# Patient Record
Sex: Female | Born: 1995 | Race: White | Hispanic: No | Marital: Married | Smoking: Never smoker
Health system: Southern US, Community
[De-identification: ages and names within clinical notes are randomized; demographics above are authoritative.]

## PROBLEM LIST (undated history)

## (undated) DIAGNOSIS — Z789 Other specified health status: Secondary | ICD-10-CM

## (undated) HISTORY — PX: NO PAST SURGERIES: SHX2092

---

## 2016-06-04 ENCOUNTER — Other Ambulatory Visit: Payer: Self-pay

## 2016-06-04 ENCOUNTER — Encounter: Payer: Self-pay | Admitting: Certified Nurse Midwife

## 2016-06-04 ENCOUNTER — Ambulatory Visit (INDEPENDENT_AMBULATORY_CARE_PROVIDER_SITE_OTHER): Payer: Managed Care, Other (non HMO) | Admitting: Certified Nurse Midwife

## 2016-06-04 VITALS — BP 109/69 | HR 84 | Ht 67.0 in | Wt 161.8 lb

## 2016-06-04 DIAGNOSIS — Z01419 Encounter for gynecological examination (general) (routine) without abnormal findings: Secondary | ICD-10-CM

## 2016-06-04 DIAGNOSIS — Z0000 Encounter for general adult medical examination without abnormal findings: Secondary | ICD-10-CM

## 2016-06-04 NOTE — Patient Instructions (Signed)
Preventive Care 18-39 Years, Female Preventive care refers to lifestyle choices and visits with your health care provider that can promote health and wellness. What does preventive care include?  A yearly physical exam. This is also called an annual well check.  Dental exams once or twice a year.  Routine eye exams. Ask your health care provider how often you should have your eyes checked.  Personal lifestyle choices, including:  Daily care of your teeth and gums.  Regular physical activity.  Eating a healthy diet.  Avoiding tobacco and drug use.  Limiting alcohol use.  Practicing safe sex.  Taking vitamin and mineral supplements as recommended by your health care provider. What happens during an annual well check? The services and screenings done by your health care provider during your annual well check will depend on your age, overall health, lifestyle risk factors, and family history of disease. Counseling  Your health care provider may ask you questions about your:  Alcohol use.  Tobacco use.  Drug use.  Emotional well-being.  Home and relationship well-being.  Sexual activity.  Eating habits.  Work and work environment.  Method of birth control.  Menstrual cycle.  Pregnancy history. Screening  You may have the following tests or measurements:  Height, weight, and BMI.  Diabetes screening. This is done by checking your blood sugar (glucose) after you have not eaten for a while (fasting).  Blood pressure.  Lipid and cholesterol levels. These may be checked every 5 years starting at age 23.  Skin check.  Hepatitis C blood test.  Hepatitis B blood test.  Sexually transmitted disease (STD) testing.  BRCA-related cancer screening. This may be done if you have a family history of breast, ovarian, tubal, or peritoneal cancers.  Pelvic exam and Pap test. This may be done every 3 years starting at age 23. Starting at age 23, this may be done every 5  years if you have a Pap test in combination with an HPV test. Discuss your test results, treatment options, and if necessary, the need for more tests with your health care provider. Vaccines  Your health care provider may recommend certain vaccines, such as:  Influenza vaccine. This is recommended every year.  Tetanus, diphtheria, and acellular pertussis (Tdap, Td) vaccine. You may need a Td booster every 10 years.  Varicella vaccine. You may need this if you have not been vaccinated.  HPV vaccine. If you are 26 or younger, you may need three doses over 6 months.  Measles, mumps, and rubella (MMR) vaccine. You may need at least one dose of MMR. You may also need a second dose.  Pneumococcal 13-valent conjugate (PCV13) vaccine. You may need this if you have certain conditions and were not previously vaccinated.  Pneumococcal polysaccharide (PPSV23) vaccine. You may need one or two doses if you smoke cigarettes or if you have certain conditions.  Meningococcal vaccine. One dose is recommended if you are age 19-21 years and a first-year college student living in a residence hall, or if you have one of several medical conditions. You may also need additional booster doses.  Hepatitis A vaccine. You may need this if you have certain conditions or if you travel or work in places where you may be exposed to hepatitis A.  Hepatitis B vaccine. You may need this if you have certain conditions or if you travel or work in places where you may be exposed to hepatitis B.  Haemophilus influenzae type b (Hib) vaccine. You may need this   if you have certain risk factors. Talk to your health care provider about which screenings and vaccines you need and how often you need them. This information is not intended to replace advice given to you by your health care provider. Make sure you discuss any questions you have with your health care provider. Document Released: 04/09/2001 Document Revised: 11/01/2015  Document Reviewed: 12/13/2014 Elsevier Interactive Patient Education  2017 Elsevier Inc.  Intrauterine Device Information An intrauterine device (IUD) is inserted into your uterus to prevent pregnancy. There are two types of IUDs available:  Copper IUD-This type of IUD is wrapped in copper wire and is placed inside the uterus. Copper makes the uterus and fallopian tubes produce a fluid that kills sperm. The copper IUD can stay in place for 10 years.  Hormone IUD-This type of IUD contains the hormone progestin (synthetic progesterone). The hormone thickens the cervical mucus and prevents sperm from entering the uterus. It also thins the uterine lining to prevent implantation of a fertilized egg. The hormone can weaken or kill the sperm that get into the uterus. One type of hormone IUD can stay in place for 5 years, and another type can stay in place for 3 years. Your health care provider will make sure you are a good candidate for a contraceptive IUD. Discuss with your health care provider the possible side effects. Advantages of an intrauterine device  IUDs are highly effective, reversible, long acting, and low maintenance.  There are no estrogen-related side effects.  An IUD can be used when breastfeeding.  IUDs are not associated with weight gain.  The copper IUD works immediately after insertion.  The hormone IUD works right away if inserted within 7 days of your period starting. You will need to use a backup method of birth control for 7 days if the hormone IUD is inserted at any other time in your cycle.  The copper IUD does not interfere with your female hormones.  The hormone IUD can make heavy menstrual periods lighter and decrease cramping.  The hormone IUD can be used for 3 or 5 years.  The copper IUD can be used for 10 years. Disadvantages of an intrauterine device  The hormone IUD can be associated with irregular bleeding patterns.  The copper IUD can make your  menstrual flow heavier and more painful.  You may experience cramping and vaginal bleeding after insertion. This information is not intended to replace advice given to you by your health care provider. Make sure you discuss any questions you have with your health care provider. Document Released: 01/16/2004 Document Revised: 07/20/2015 Document Reviewed: 08/02/2012 Elsevier Interactive Patient Education  2017 Elsevier Inc. Etonogestrel implant What is this medicine? ETONOGESTREL (et oh noe JES trel) is a contraceptive (birth control) device. It is used to prevent pregnancy. It can be used for up to 3 years. This medicine may be used for other purposes; ask your health care provider or pharmacist if you have questions. COMMON BRAND NAME(S): Implanon, Nexplanon What should I tell my health care provider before I take this medicine? They need to know if you have any of these conditions: -abnormal vaginal bleeding -blood vessel disease or blood clots -cancer of the breast, cervix, or liver -depression -diabetes -gallbladder disease -headaches -heart disease or recent heart attack -high blood pressure -high cholesterol -kidney disease -liver disease -renal disease -seizures -tobacco smoker -an unusual or allergic reaction to etonogestrel, other hormones, anesthetics or antiseptics, medicines, foods, dyes, or preservatives -pregnant or trying to get  pregnant -breast-feeding How should I use this medicine? This device is inserted just under the skin on the inner side of your upper arm by a health care professional. Talk to your pediatrician regarding the use of this medicine in children. Special care may be needed. Overdosage: If you think you have taken too much of this medicine contact a poison control center or emergency room at once. NOTE: This medicine is only for you. Do not share this medicine with others. What if I miss a dose? This does not apply. What may interact with this  medicine? Do not take this medicine with any of the following medications: -amprenavir -bosentan -fosamprenavir This medicine may also interact with the following medications: -barbiturate medicines for inducing sleep or treating seizures -certain medicines for fungal infections like ketoconazole and itraconazole -grapefruit juice -griseofulvin -medicines to treat seizures like carbamazepine, felbamate, oxcarbazepine, phenytoin, topiramate -modafinil -phenylbutazone -rifampin -rufinamide -some medicines to treat HIV infection like atazanavir, indinavir, lopinavir, nelfinavir, tipranavir, ritonavir -St. John's wort This list may not describe all possible interactions. Give your health care provider a list of all the medicines, herbs, non-prescription drugs, or dietary supplements you use. Also tell them if you smoke, drink alcohol, or use illegal drugs. Some items may interact with your medicine. What should I watch for while using this medicine? This product does not protect you against HIV infection (AIDS) or other sexually transmitted diseases. You should be able to feel the implant by pressing your fingertips over the skin where it was inserted. Contact your doctor if you cannot feel the implant, and use a non-hormonal birth control method (such as condoms) until your doctor confirms that the implant is in place. If you feel that the implant may have broken or become bent while in your arm, contact your healthcare provider. What side effects may I notice from receiving this medicine? Side effects that you should report to your doctor or health care professional as soon as possible: -allergic reactions like skin rash, itching or hives, swelling of the face, lips, or tongue -breast lumps -changes in emotions or moods -depressed mood -heavy or prolonged menstrual bleeding -pain, irritation, swelling, or bruising at the insertion site -scar at site of insertion -signs of infection at the  insertion site such as fever, and skin redness, pain or discharge -signs of pregnancy -signs and symptoms of a blood clot such as breathing problems; changes in vision; chest pain; severe, sudden headache; pain, swelling, warmth in the leg; trouble speaking; sudden numbness or weakness of the face, arm or leg -signs and symptoms of liver injury like dark yellow or brown urine; general ill feeling or flu-like symptoms; light-colored stools; loss of appetite; nausea; right upper belly pain; unusually weak or tired; yellowing of the eyes or skin -unusual vaginal bleeding, discharge -signs and symptoms of a stroke like changes in vision; confusion; trouble speaking or understanding; severe headaches; sudden numbness or weakness of the face, arm or leg; trouble walking; dizziness; loss of balance or coordination Side effects that usually do not require medical attention (report to your doctor or health care professional if they continue or are bothersome): -acne -back pain -breast pain -changes in weight -dizziness -general ill feeling or flu-like symptoms -headache -irregular menstrual bleeding -nausea -sore throat -vaginal irritation or inflammation This list may not describe all possible side effects. Call your doctor for medical advice about side effects. You may report side effects to FDA at 1-800-FDA-1088. Where should I keep my medicine? This drug is given  in a hospital or clinic and will not be stored at home. NOTE: This sheet is a summary. It may not cover all possible information. If you have questions about this medicine, talk to your doctor, pharmacist, or health care provider.  2018 Elsevier/Gold Standard (2015-08-31 11:19:22)

## 2016-06-04 NOTE — Progress Notes (Signed)
ANNUAL PREVENTATIVE CARE GYN  ENCOUNTER NOTE  Subjective:       April Nicholson is a 21 y.o. G0P0000 female here for a routine annual gynecologic exam.    April Nicholson is currently taking LoLo Estrin and would like to change to a highly effective, low maintenance, lower cost form of pregnancy prevention.  Denies difficulty breathing or respiratory distress, chest pain, abdominal pain, unexplained vaginal bleeding, and leg pain or swelling.    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. She moved here from [place] in December 2017 with her husband.   Gynecologic History  Patient's last menstrual period was 05/13/2016 (approximate).   Contraception: OCP (estrogen/progesterone)   Last Pap: never.  Obstetric History OB History  Gravida Para Term Preterm AB Living  0 0 0 0 0 0  SAB TAB Ectopic Multiple Live Births  0 0 0 0 0        Allergies  Allergen Reactions  . Compazine [Prochlorperazine Edisylate] Other (See Comments)    seizure    Social History   Social History  . Marital status: Married    Spouse name: N/A  . Number of children: N/A  . Years of education: N/A   Occupational History  . Not on file.   Social History Main Topics  . Smoking status: Never Smoker  . Smokeless tobacco: Never Used  . Alcohol use No  . Drug use: No  . Sexual activity: Yes    Partners: Male    Birth control/ protection: Pill   Other Topics Concern  . Not on file   Social History Narrative  . No narrative on file    Family History  Problem Relation Age of Onset  . Asthma Maternal Grandmother   . Hypertension Maternal Grandmother     The following portions of the patient's history were reviewed and updated as appropriate: allergies, current medications, past family history, past medical history, past social history, past surgical history and problem list.  Review of Systems  ROS negative except as noted above. Information obtained from patient.    Objective:   BP 109/69    Pulse 84   Ht  (1.702 m)   Wt 161 lb 12.8 oz (73.4 kg)   LMP 05/13/2016 (Approximate)   BMI 25.34 kg/m    CONSTITUTIONAL: Well-developed, well-nourished female in no acute distress.   PSYCHIATRIC: Normal mood and affect. Normal behavior. Normal judgment and thought content.  NEUROLGIC: Alert and oriented to person, place, and time. Normal muscle tone coordination. No cranial nerve deficit noted.  HENT:  Normocephalic, atraumatic, External right and left ear normal. Oropharynx is clear and moist.  EYES: Conjunctivae and EOM are normal. Pupils are equal, round, and reactive to light. No scleral icterus.   NECK: Normal range of motion, supple, no masses.  Normal thyroid.   SKIN: Skin is warm and dry. No rash noted. Not diaphoretic. No erythema. No pallor.  CARDIOVASCULAR: Normal heart rate noted, regular rhythm, no murmur.  RESPIRATORY: Clear to auscultation bilaterally. Effort and breath sounds normal, no problems with respiration noted.  BREASTS: Symmetric in size. No masses, skin changes, nipple drainage, or lymphadenopathy.  ABDOMEN: Soft, normal bowel sounds, no distention noted.  No tenderness, rebound or guarding.   PELVIC:  External Genitalia: Normal  Vagina: Normal  Cervix: Normal  Uterus: Normal  Adnexa: Normal   MUSCULOSKELETAL: Normal range of motion. No tenderness.  No cyanosis, clubbing, or edema.  2+ distal pulses.  LYMPHATIC: No Axillary, Supraclavicular, or Inguinal Adenopathy.  Assessment:   Annual gynecologic examination 21 y.o.   Contraception: IUD and Nexplanon   Overweight   Problem List Items Addressed This Visit    None    Visit Diagnoses    Wellness examination    -  Primary   Relevant Orders   Cytology - PAP      Plan:   Pap: Pap, Reflex if ASCUS   Labs: Declines   Routine preventative health maintenance measures emphasized: Exercise/Diet/Weight control, Alcohol/Substance use risks, Stress Management and Peer Pressure Issues    Education given regarding options for contraception, including injectable contraception, IUD placement, oral contraceptives, hormonal implant.  RTC x 1 year for annual exam  RTC x 2 weeks for IUD or Nexplanon placement   Gunnar Bulla, CNM

## 2016-06-06 LAB — CYTOLOGY - PAP

## 2016-06-08 ENCOUNTER — Encounter: Payer: Self-pay | Admitting: Certified Nurse Midwife

## 2016-06-11 ENCOUNTER — Ambulatory Visit (INDEPENDENT_AMBULATORY_CARE_PROVIDER_SITE_OTHER): Payer: Managed Care, Other (non HMO) | Admitting: Certified Nurse Midwife

## 2016-06-11 ENCOUNTER — Other Ambulatory Visit: Payer: Self-pay | Admitting: Certified Nurse Midwife

## 2016-06-11 ENCOUNTER — Other Ambulatory Visit (INDEPENDENT_AMBULATORY_CARE_PROVIDER_SITE_OTHER): Payer: Managed Care, Other (non HMO)

## 2016-06-11 ENCOUNTER — Encounter: Payer: Self-pay | Admitting: Certified Nurse Midwife

## 2016-06-11 VITALS — BP 108/68 | HR 63 | Ht 67.0 in | Wt 162.0 lb

## 2016-06-11 DIAGNOSIS — Z30014 Encounter for initial prescription of intrauterine contraceptive device: Secondary | ICD-10-CM

## 2016-06-11 LAB — POCT URINE PREGNANCY: PREG TEST UR: NEGATIVE

## 2016-06-11 NOTE — Patient Instructions (Signed)

## 2016-06-13 ENCOUNTER — Other Ambulatory Visit: Payer: Managed Care, Other (non HMO)

## 2016-06-17 ENCOUNTER — Ambulatory Visit: Payer: Managed Care, Other (non HMO) | Admitting: Certified Nurse Midwife

## 2016-06-19 NOTE — Progress Notes (Signed)
April Nicholson is a 21 y.o. year old G36P0000 Caucasian female who presents for placement of a Liletta IUD.  BP 108/68   Pulse 63   Ht  (1.702 m)   Wt 162 lb (73.5 kg)   LMP 05/13/2016 (Approximate)   BMI 25.37 kg/m    Pregnancy test today was negative. Pt was taking OCPs prior to today's visit and started placebo pills yesterday.   The risks and benefits of the method and placement have been thouroughly reviewed with the patient and all questions were answered.  Specifically the patient is aware of failure rate of 02/998, expulsion of the IUD and of possible perforation.  The patient is aware of irregular bleeding due to the method and understands the incidence of irregular bleeding diminishes with time.  Signed copy of informed consent in chart.   Time out was performed.  A medium plastic speculum was placed in the vagina.  The cervix was visualized, prepped using Betadine, and grasped with a single tooth tenaculum. The uterus was found to be neutral and it sounded to 6 cm.  Liletta IUD placed per manufacturer's recommendations.   The strings were trimmed to 3 cm.  The patient was given post procedure instructions, including signs and symptoms of infection and to check for the strings after each menses or each month, and refraining from intercourse or anything in the vagina for 3 days.  She was given a [place] care card with date Lilletta placed, and date Liletta to be removed.  Immediately following the procedure, [name] reported severe abdominal pain and began to vomit.   Sonogram was performed and the proper placement of the IUD was verified via transvaginal u/s. See below.   Advised pt uterine cramping normal after insertion of IUD and vomiting likely due to vasovagal response.   Reviewed red flag symptoms and when to call including PAINS acroymn.   RTC x 6 weeks for string check or sooner if needed.    Gunnar Bulla, CNM  ULTRASOUND REPORT  Location:  [facility] Date of Service:  06/11/16   Indications:IUD placement and Pelvic Pain Findings:  The uterus measures 6.4 x 3 x 4 cm. Echo texture is homogenous without evidence of focal masses.  The Endometrium measures 4.6 mm. IUD is seen in the fundal endometrium.  Right Ovary measures 4.1 x 3 x 3.8 cm. Small simple cyst on right ovary measures 3.3 x 2.2 x 3cm. Left Ovary measures 2.7 x 1.3 x 1.7 cm. It is normal appearance. Survey of the adnexa demonstrates no adnexal masses. There is no free fluid in the cul de sac.  Impression: 1. Small simple cyst on right ovary.  Recommendations: 1.Clinical correlation with the patient's History and Physical Exam.   Gunnar Bulla, CNM

## 2016-07-10 ENCOUNTER — Encounter: Payer: Self-pay | Admitting: Certified Nurse Midwife

## 2016-07-26 ENCOUNTER — Ambulatory Visit (INDEPENDENT_AMBULATORY_CARE_PROVIDER_SITE_OTHER): Payer: Managed Care, Other (non HMO) | Admitting: Certified Nurse Midwife

## 2016-07-26 ENCOUNTER — Encounter: Payer: Self-pay | Admitting: Certified Nurse Midwife

## 2016-07-26 VITALS — BP 105/64 | HR 61 | Ht 67.0 in | Wt 153.6 lb

## 2016-07-26 DIAGNOSIS — Z30431 Encounter for routine checking of intrauterine contraceptive device: Secondary | ICD-10-CM | POA: Diagnosis not present

## 2016-07-26 NOTE — Progress Notes (Signed)
Here for IUD string check                                                                                                               Pt is here for an IUD string check. No c/o.

## 2016-07-28 MED ORDER — LEVONORGESTREL 18.6 MCG/DAY IU IUD: INTRAUTERINE_SYSTEM | Freq: Once | INTRAUTERINE | Status: DC

## 2016-07-28 NOTE — Progress Notes (Signed)
GYN ENCOUNTER NOTE  Subjective:       April Nicholson is a 21 y.o. G0P0000 female here for IUD string check.   Her IUD was placed by myself on 06/11/2016 and placement was verified immediately following the procedure with ultrasound.   She reports spotting for the first two (2) to three (3) weeks after placement.   Denies difficulty breathing or respiratory distress, chest pain, abdominal pain, vaginal bleeding, dyspareunia, and leg pain or swelling.   Gynecologic History  No LMP recorded. Patient is not currently having periods (Reason: IUD).  Contraception: IUD/Liletta  Last Pap: 06/04/2016. Results were: normal  Obstetric History  OB History  Gravida Para Term Preterm AB Living  0 0 0 0 0 0  SAB TAB Ectopic Multiple Live Births  0 0 0 0 0       History reviewed. No pertinent past medical history.  History reviewed. No pertinent surgical history.  Current Outpatient Prescriptions on File Prior to Visit  Medication Sig Dispense Refill  . loratadine (CLARITIN) 10 MG tablet Take 10 mg by mouth daily.     No current facility-administered medications on file prior to visit.     Allergies  Allergen Reactions  . Compazine [Prochlorperazine Edisylate] Other (See Comments)    seizure    Social History   Social History  . Marital status: Married    Spouse name: N/A  . Number of children: N/A  . Years of education: N/A   Occupational History  . Not on file.   Social History Main Topics  . Smoking status: Never Smoker  . Smokeless tobacco: Never Used  . Alcohol use No  . Drug use: No  . Sexual activity: Yes    Partners: Male    Birth control/ protection: IUD   Other Topics Concern  . Not on file   Social History Narrative  . No narrative on file    Family History  Problem Relation Age of Onset  . Asthma Maternal Grandmother   . Hypertension Maternal Grandmother     The following portions of the patient's history were reviewed and updated as  appropriate: allergies, current medications, past family history, past medical history, past social history, past surgical history and problem list.  Review of Systems  Review of Systems - Negative except as noted above. History obtained from the patient.  Objective:   BP 105/64   Pulse 61   Ht 5\' 7"  (1.702 m)   Wt 153 lb 9 oz (69.7 kg)   BMI 24.05 kg/m   CONSTITUTIONAL: Well-developed, well-nourished female in no acute distress.   PELVIC:  External Genitalia: Normal  BUS: Normal  Vagina: Normal  Cervix: Normal, strings present   Assessment:   1. IUD check up  Plan:   Reviewed red flag symptoms and when to call.   RTC x 1 year for annual exam or sooner if needed.    Gunnar BullaJenkins Michelle Meryn Sarracino, CNM

## 2016-07-28 NOTE — Patient Instructions (Signed)

## 2017-05-18 ENCOUNTER — Encounter: Payer: Self-pay | Admitting: Certified Nurse Midwife

## 2017-05-20 ENCOUNTER — Other Ambulatory Visit: Payer: Self-pay | Admitting: Certified Nurse Midwife

## 2017-05-20 DIAGNOSIS — Z30431 Encounter for routine checking of intrauterine contraceptive device: Secondary | ICD-10-CM

## 2017-05-20 NOTE — Telephone Encounter (Signed)
Advise patient to make an appointment for ultrasound to check placement and to discuss mood changed. I am out until Thursday, but she may see another midwife if a different day is better for her. Thanks, JML

## 2017-05-21 ENCOUNTER — Ambulatory Visit (INDEPENDENT_AMBULATORY_CARE_PROVIDER_SITE_OTHER): Payer: Managed Care, Other (non HMO) | Admitting: Certified Nurse Midwife

## 2017-05-21 ENCOUNTER — Ambulatory Visit (INDEPENDENT_AMBULATORY_CARE_PROVIDER_SITE_OTHER): Payer: BLUE CROSS/BLUE SHIELD

## 2017-05-21 VITALS — BP 107/62 | HR 56 | Ht 67.0 in | Wt 152.1 lb

## 2017-05-21 DIAGNOSIS — Z30431 Encounter for routine checking of intrauterine contraceptive device: Secondary | ICD-10-CM

## 2017-05-21 DIAGNOSIS — N939 Abnormal uterine and vaginal bleeding, unspecified: Secondary | ICD-10-CM | POA: Diagnosis not present

## 2017-05-21 MED ORDER — LEVONORGESTREL-ETHINYL ESTRAD 0.15-30 MG-MCG PO TABS: 1.0000 | ORAL_TABLET | Freq: Every day | ORAL | 11 refills | Status: DC

## 2017-05-21 NOTE — Patient Instructions (Signed)

## 2017-05-21 NOTE — Progress Notes (Signed)
Pt is here for a followup on IUD placement U/S.

## 2017-05-21 NOTE — Progress Notes (Signed)
GYN ENCOUNTER NOTE  Subjective:       April Nicholson is a 22 y.o. G0P0000 female is here for gynecologic evaluation of the following issues:  1. Abnormal uterine bleeding with IUD placement and hormonal mood changes.  She had an IUD place in April 2018 with no issues until recently she has notieced that the period is becoming heavier. She was advised by Serafina RoyalsMichelle Lawhorn CNM to make an appointment today for u/s to confirm placement.    Gynecologic History No LMP recorded. (Menstrual status: IUD). Contraception: IUD Last Pap: 06/04/16. Results were: normal   Obstetric History OB History  Gravida Para Term Preterm AB Living  0 0 0 0 0 0  SAB TAB Ectopic Multiple Live Births  0 0 0 0 0    No past medical history on file.  No past surgical history on file.  Current Outpatient Medications on File Prior to Visit  Medication Sig Dispense Refill  . Levonorgestrel (LILETTA) 19.5 MCG/DAY IUD IUD 1 each by Intrauterine route once.    . loratadine (CLARITIN) 10 MG tablet Take 10 mg by mouth daily.     Current Facility-Administered Medications on File Prior to Visit  Medication Dose Route Frequency Provider Last Rate Last Dose  . levonorgestrel (LILETTA) 18.6 MCG/DAY IUD   Intrauterine Once Lawhorn, Vanessa DurhamJenkins Michelle, CNM        Allergies  Allergen Reactions  . Compazine [Prochlorperazine Edisylate] Other (See Comments)    seizure    Social History   Socioeconomic History  . Marital status: Married    Spouse name: Not on file  . Number of children: Not on file  . Years of education: Not on file  . Highest education level: Not on file  Occupational History  . Not on file  Social Needs  . Financial resource strain: Not on file  . Food insecurity:    Worry: Not on file    Inability: Not on file  . Transportation needs:    Medical: Not on file    Non-medical: Not on file  Tobacco Use  . Smoking status: Never Smoker  . Smokeless tobacco: Never Used  Substance and Sexual  Activity  . Alcohol use: No  . Drug use: No  . Sexual activity: Yes    Partners: Male    Birth control/protection: IUD  Lifestyle  . Physical activity:    Days per week: Not on file    Minutes per session: Not on file  . Stress: Not on file  Relationships  . Social connections:    Talks on phone: Not on file    Gets together: Not on file    Attends religious service: Not on file    Active member of club or organization: Not on file    Attends meetings of clubs or organizations: Not on file    Relationship status: Not on file  . Intimate partner violence:    Fear of current or ex partner: Not on file    Emotionally abused: Not on file    Physically abused: Not on file    Forced sexual activity: Not on file  Other Topics Concern  . Not on file  Social History Narrative  . Not on file    Family History  Problem Relation Age of Onset  . Asthma Maternal Grandmother   . Hypertension Maternal Grandmother     The following portions of the patient's history were reviewed and updated as appropriate: allergies, current medications, past family history, past medical  history, past social history, past surgical history and problem list.  Review of Systems Review of Systems - Negative except as mentioned in HPI Review of Systems - General ROS: negative for - chills, fatigue, fever, hot flashes, malaise or night sweats Hematological and Lymphatic ROS: negative for - bleeding problems or swollen lymph nodes Gastrointestinal ROS: negative for - abdominal pain, blood in stools, change in bowel habits and nausea/vomiting Musculoskeletal ROS: negative for - joint pain, muscle pain or muscular weakness Genito-Urinary ROS: negative for - change in menstrual cycle, dysmenorrhea, dyspareunia, dysuria, genital discharge, genital ulcers, hematuria, incontinence, irregular bleeding, nocturia or pelvic pain. Positive for /heavy menses, and mood changes  Objective:   BP 107/62   Pulse (!) 56   Ht  5\' 7"  (1.702 m)   Wt 152 lb 1 oz (69 kg)   BMI 23.82 kg/m  CONSTITUTIONAL: Well-developed, well-nourished female in no acute distress.  HENT:  Normocephalic, atraumatic.  NECK: Normal range of motion, supple, no masses.   SKIN: Skin is warm and dry. No rash noted. Not diaphoretic. No erythema. No pallor. NEUROLGIC: Alert and oriented to person, place, and time.  PSYCHIATRIC: Normal mood and affect. Normal behavior. Normal judgment and thought content. CARDIOVASCULAR:Not Examined RESPIRATORY: Not Examined BREASTS: Not Examined ABDOMEN: Soft, non distended; Non tender.  No Organomegaly. PELVIC: not indicated MUSCULOSKELETAL: Normal range of motion. No tenderness.  No cyanosis, clubbing, or edema.  ULTRASOUND REPORT  Location: [facility] Date of Service:  05/21/2017   Indications: IUD Check Findings:  The uterus measures 6.0 x 4.4 x 2.6 cm. Echo texture is homogeneous without evidence of focal masses. The Endometrium is unable to be delineated due to the presence of the IUD. The IUD appears to be in the correct location within the endometrium.  Right Ovary measures 3.9 x 2.4 x 1.8 cm. It is normal in appearance. Left Ovary measures 2.2 x 1.4 x 1.2 cm. It is normal appearance. Survey of the adnexa demonstrates no adnexal masses. There is no free fluid in the cul de sac.  Impression: 1. Anteverted uterus appears of normal size and contour. 2. The IUD appears to be in the correct location within the endometrium. 3. Bilateral ovaries appear WNL.  Recommendations: 1.Clinical correlation with the patient's History and Physical Exam.   Assessment:   IUD in correct position    Plan:   Reviewed u/s results. Discussed options of use of birth control pill x 3 months to help control bleeding and improve mood or use of lysteda to help manage heavy bleeding. Reviewed risks and benefits. Discussed option of removing IUD. She would like to try the birth control pill x 3  months . Reviewed use. She has taken in the past. She denies any contra indications for pill. Follow up PRN.   I attest more than 50% of visit spent reviewing history, discussing u/s results with patient , discussing treatment options and answering questions. Face to face time 15 min.  Doreene Burke, CNM  [facility]

## 2017-07-28 ENCOUNTER — Encounter: Payer: Managed Care, Other (non HMO) | Admitting: Certified Nurse Midwife

## 2020-09-27 DIAGNOSIS — Z3481 Encounter for supervision of other normal pregnancy, first trimester: Secondary | ICD-10-CM | POA: Diagnosis not present

## 2020-09-27 DIAGNOSIS — Z3685 Encounter for antenatal screening for Streptococcus B: Secondary | ICD-10-CM | POA: Diagnosis not present

## 2020-09-27 LAB — OB RESULTS CONSOLE GC/CHLAMYDIA
Chlamydia: NEGATIVE
Gonorrhea: NEGATIVE

## 2020-09-27 LAB — OB RESULTS CONSOLE ANTIBODY SCREEN: Antibody Screen: NEGATIVE

## 2020-09-27 LAB — OB RESULTS CONSOLE RUBELLA ANTIBODY, IGM: Rubella: IMMUNE

## 2020-09-27 LAB — OB RESULTS CONSOLE ABO/RH: RH Type: POSITIVE

## 2020-09-27 LAB — HEPATITIS C ANTIBODY: HCV Ab: NEGATIVE

## 2020-09-27 LAB — OB RESULTS CONSOLE RPR: RPR: NONREACTIVE

## 2020-09-27 LAB — OB RESULTS CONSOLE HEPATITIS B SURFACE ANTIGEN: Hepatitis B Surface Ag: NEGATIVE

## 2020-09-27 LAB — OB RESULTS CONSOLE HIV ANTIBODY (ROUTINE TESTING): HIV: NONREACTIVE

## 2020-09-28 DIAGNOSIS — Z3400 Encounter for supervision of normal first pregnancy, unspecified trimester: Secondary | ICD-10-CM | POA: Diagnosis not present

## 2020-09-28 DIAGNOSIS — Z113 Encounter for screening for infections with a predominantly sexual mode of transmission: Secondary | ICD-10-CM | POA: Diagnosis not present

## 2020-10-24 DIAGNOSIS — Z315 Encounter for genetic counseling: Secondary | ICD-10-CM | POA: Diagnosis not present

## 2020-11-24 DIAGNOSIS — Z3A18 18 weeks gestation of pregnancy: Secondary | ICD-10-CM | POA: Diagnosis not present

## 2020-11-24 DIAGNOSIS — Z363 Encounter for antenatal screening for malformations: Secondary | ICD-10-CM | POA: Diagnosis not present

## 2020-12-21 DIAGNOSIS — Z362 Encounter for other antenatal screening follow-up: Secondary | ICD-10-CM | POA: Diagnosis not present

## 2020-12-21 DIAGNOSIS — Z3A22 22 weeks gestation of pregnancy: Secondary | ICD-10-CM | POA: Diagnosis not present

## 2020-12-21 DIAGNOSIS — Z23 Encounter for immunization: Secondary | ICD-10-CM | POA: Diagnosis not present

## 2021-01-01 DIAGNOSIS — M5489 Other dorsalgia: Secondary | ICD-10-CM | POA: Diagnosis not present

## 2021-01-24 DIAGNOSIS — Z363 Encounter for antenatal screening for malformations: Secondary | ICD-10-CM | POA: Diagnosis not present

## 2021-01-24 DIAGNOSIS — Z23 Encounter for immunization: Secondary | ICD-10-CM | POA: Diagnosis not present

## 2021-02-25 ENCOUNTER — Other Ambulatory Visit: Payer: Self-pay

## 2021-02-25 ENCOUNTER — Encounter (HOSPITAL_COMMUNITY): Payer: Self-pay | Admitting: Obstetrics and Gynecology

## 2021-02-25 ENCOUNTER — Inpatient Hospital Stay (HOSPITAL_COMMUNITY)
Admission: AD | Admit: 2021-02-25 | Discharge: 2021-02-25 | Disposition: A | Discharge status: Home / Self Care | Payer: BC Managed Care – PPO | Source: Ambulatory Visit | Attending: Obstetrics and Gynecology | Admitting: Obstetrics and Gynecology

## 2021-02-25 ENCOUNTER — Inpatient Hospital Stay (HOSPITAL_BASED_OUTPATIENT_CLINIC_OR_DEPARTMENT_OTHER): Payer: BC Managed Care – PPO

## 2021-02-25 DIAGNOSIS — R1010 Upper abdominal pain, unspecified: Secondary | ICD-10-CM | POA: Diagnosis not present

## 2021-02-25 DIAGNOSIS — O212 Late vomiting of pregnancy: Secondary | ICD-10-CM | POA: Insufficient documentation

## 2021-02-25 DIAGNOSIS — O26893 Other specified pregnancy related conditions, third trimester: Secondary | ICD-10-CM | POA: Insufficient documentation

## 2021-02-25 DIAGNOSIS — O219 Vomiting of pregnancy, unspecified: Secondary | ICD-10-CM | POA: Diagnosis not present

## 2021-02-25 DIAGNOSIS — O368330 Maternal care for abnormalities of the fetal heart rate or rhythm, third trimester, not applicable or unspecified: Secondary | ICD-10-CM

## 2021-02-25 DIAGNOSIS — Z3A32 32 weeks gestation of pregnancy: Secondary | ICD-10-CM | POA: Insufficient documentation

## 2021-02-25 DIAGNOSIS — B349 Viral infection, unspecified: Secondary | ICD-10-CM

## 2021-02-25 DIAGNOSIS — O4700 False labor before 37 completed weeks of gestation, unspecified trimester: Secondary | ICD-10-CM

## 2021-02-25 DIAGNOSIS — Z20822 Contact with and (suspected) exposure to covid-19: Secondary | ICD-10-CM | POA: Insufficient documentation

## 2021-02-25 HISTORY — DX: Other specified health status: Z78.9

## 2021-02-25 LAB — COMPREHENSIVE METABOLIC PANEL
ALT: 34 U/L (ref 0–44)
AST: 25 U/L (ref 15–41)
Albumin: 3 g/dL — ABNORMAL LOW (ref 3.5–5.0)
Alkaline Phosphatase: 62 U/L (ref 38–126)
Anion gap: 9 (ref 5–15)
BUN: 9 mg/dL (ref 6–20)
CO2: 21 mmol/L — ABNORMAL LOW (ref 22–32)
Calcium: 8.5 mg/dL — ABNORMAL LOW (ref 8.9–10.3)
Chloride: 105 mmol/L (ref 98–111)
Creatinine, Ser: 0.64 mg/dL (ref 0.44–1.00)
GFR, Estimated: >60 mL/min (ref >60)
Glucose, Bld: 83 mg/dL (ref 70–99)
Potassium: 3.6 mmol/L (ref 3.5–5.1)
Sodium: 135 mmol/L (ref 135–145)
Total Bilirubin: 0.9 mg/dL (ref 0.3–1.2)
Total Protein: 6.2 g/dL — ABNORMAL LOW (ref 6.5–8.1)

## 2021-02-25 LAB — CBC WITH DIFFERENTIAL/PLATELET
Abs Immature Granulocytes: 0.14 10*3/uL — ABNORMAL HIGH (ref 0.00–0.07)
Basophils Absolute: 0 10*3/uL (ref 0.0–0.1)
Basophils Relative: 0 %
Eosinophils Absolute: 0 10*3/uL (ref 0.0–0.5)
Eosinophils Relative: 0 %
HCT: 40.3 % (ref 36.0–46.0)
Hemoglobin: 14.3 g/dL (ref 12.0–15.0)
Immature Granulocytes: 1 %
Lymphocytes Relative: 6 %
Lymphs Abs: 0.7 10*3/uL (ref 0.7–4.0)
MCH: 32.4 pg (ref 26.0–34.0)
MCHC: 35.5 g/dL (ref 30.0–36.0)
MCV: 91.4 fL (ref 80.0–100.0)
Monocytes Absolute: 0.7 10*3/uL (ref 0.1–1.0)
Monocytes Relative: 6 %
Neutro Abs: 10.9 10*3/uL — ABNORMAL HIGH (ref 1.7–7.7)
Neutrophils Relative %: 87 %
Platelets: 208 10*3/uL (ref 150–400)
RBC: 4.41 MIL/uL (ref 3.87–5.11)
RDW: 12.5 % (ref 11.5–15.5)
WBC: 12.5 10*3/uL — ABNORMAL HIGH (ref 4.0–10.5)
nRBC: 0 % (ref 0.0–0.2)

## 2021-02-25 LAB — URINALYSIS, ROUTINE W REFLEX MICROSCOPIC
Bilirubin Urine: NEGATIVE
Glucose, UA: NEGATIVE mg/dL
Hgb urine dipstick: NEGATIVE
Ketones, ur: 80 mg/dL — AB
Leukocytes,Ua: NEGATIVE
Nitrite: NEGATIVE
Protein, ur: NEGATIVE mg/dL
Specific Gravity, Urine: 1.02 (ref 1.005–1.030)
pH: 5 (ref 5.0–8.0)

## 2021-02-25 LAB — RESP PANEL BY RT-PCR (FLU A&B, COVID) ARPGX2
Influenza A by PCR: NEGATIVE
Influenza B by PCR: NEGATIVE
SARS Coronavirus 2 by RT PCR: NEGATIVE

## 2021-02-25 LAB — AMNISURE RUPTURE OF MEMBRANE (ROM) NOT AT ARMC: Amnisure ROM: NEGATIVE

## 2021-02-25 LAB — POCT FERN TEST: POCT Fern Test: NEGATIVE

## 2021-02-25 MED ORDER — LACTATED RINGERS IV BOLUS
1000.0000 mL | Freq: Once | INTRAVENOUS | Status: AC
  Administered 2021-02-25: 1000 mL via INTRAVENOUS

## 2021-02-25 MED ORDER — ONDANSETRON 4 MG PO TBDP: 4.0000 mg | ORAL_TABLET | Freq: Four times a day (QID) | ORAL | 0 refills | Status: DC | PRN

## 2021-02-25 MED ORDER — ONDANSETRON HCL 4 MG/2ML IJ SOLN
4.0000 mg | Freq: Once | INTRAMUSCULAR | Status: AC
  Administered 2021-02-25: 4 mg via INTRAVENOUS
  Filled 2021-02-25: qty 2

## 2021-02-25 MED ORDER — SCOPOLAMINE 1 MG/3DAYS TD PT72: 1.0000 | MEDICATED_PATCH | TRANSDERMAL | 12 refills | Status: DC

## 2021-02-25 MED ORDER — ONDANSETRON 4 MG PO TBDP
4.0000 mg | ORAL_TABLET | Freq: Once | ORAL | Status: AC
  Administered 2021-02-25: 4 mg via ORAL
  Filled 2021-02-25: qty 1

## 2021-02-25 MED ORDER — NIFEDIPINE 10 MG PO CAPS
10.0000 mg | ORAL_CAPSULE | Freq: Once | ORAL | Status: AC
  Administered 2021-02-25: 10 mg via ORAL
  Filled 2021-02-25: qty 1

## 2021-02-25 MED ORDER — M.V.I. ADULT IV INJ
INTRAVENOUS | Status: AC
  Filled 2021-02-25: qty 10

## 2021-02-25 NOTE — L&D Delivery Note (Signed)
Delivery Note ?At 3:29 AM a viable female was delivered via Vaginal, Spontaneous (Presentation: Left Occiput Anterior).  APGAR: 8, 9; weight  .   ?Placenta status: Spontaneous, Intact.  Cord: 3 vessels with the following complications: None.  Uterus manually explored and cleared of clot and debris. ? ?Nuchal cord noted and reduced mid delivery.  ? ?Anesthesia: Epidural ?Episiotomy: None ?Lacerations: 1st degree;Perineal ?Suture Repair: 2.0 vicryl ?Est. Blood Loss (mL): 100 ? ?Mom to postpartum.  Baby to Couplet care / Skin to Skin. ? ?Lyn Henri ?04/27/2021, 4:38 AM ? ? ? ?

## 2021-02-25 NOTE — MAU Provider Note (Signed)
History     CSN: [id number]  Arrival date and time: 02/25/21 1652   Event Date/Time   First Provider Initiated Contact with Patient 02/25/21 1948      Chief Complaint  Patient presents with   Emesis   Nausea   Abdominal Pain   HPI April Nicholson is a 26 y.o. G1P0000 at [redacted]w[redacted]d who presents to MAU with chief complaints of nausea, vomiting and abdominal pain. These are new problems, onset this morning at 1130. Patient's abdominal pain is "sharp", located across the top of her abdomen, pain score 4/10. She has not taken medication or tried other treatments for this complaint. She states initially the pain was triggered by her vomiting but as her symptoms continued today all her symptoms began to occur simultaneously.  Patient reports leaking of fluid each time she vomits. Liquid is not malodorous. She denies vaginal bleeding, decreased fetal movement, fever, falls, or recent illness.   Patient receives care with [facility] OB History     Gravida  1   Para  0   Term  0   Preterm  0   AB  0   Living  0      SAB  0   IAB  0   Ectopic  0   Multiple  0   Live Births  0           Past Medical History:  Diagnosis Date   Medical history non-contributory     Past Surgical History:  Procedure Laterality Date   NO PAST SURGERIES      Family History  Problem Relation Age of Onset   Asthma Maternal Grandmother    Hypertension Maternal Grandmother     Social History   Tobacco Use   Smoking status: Never   Smokeless tobacco: Never  Vaping Use   Vaping Use: Never used  Substance Use Topics   Alcohol use: No   Drug use: No    Allergies:  Allergies  Allergen Reactions   Compazine [Prochlorperazine Edisylate] Other (See Comments)    seizure    Facility-Administered Medications Prior to Admission  Medication Dose Route Frequency Provider Last Rate Last Admin   levonorgestrel (LILETTA) 18.6 MCG/DAY IUD   Intrauterine Once Lawhorn, Vanessa Frederick, CNM       Medications Prior to Admission  Medication Sig Dispense Refill Last Dose   Prenatal Vit-Fe Fumarate-FA (PRENATAL MULTIVITAMIN) TABS tablet Take 1 tablet by mouth daily at 12 noon.   02/24/2021   pyridoxine (B-6) 100 MG tablet Take 100 mg by mouth daily.   02/24/2021   Levonorgestrel (LILETTA) 19.5 MCG/DAY IUD IUD 1 each by Intrauterine route once.      levonorgestrel-ethinyl estradiol (NORDETTE) 0.15-30 MG-MCG tablet Take 1 tablet by mouth daily. 1 Package 11    loratadine (CLARITIN) 10 MG tablet Take 10 mg by mouth daily.       Review of Systems  Constitutional:  Positive for fatigue. Negative for fever.  Respiratory:  Negative for shortness of breath.   Gastrointestinal:  Positive for abdominal pain, nausea and vomiting.  Genitourinary:  Negative for dysuria and flank pain.  All other systems reviewed and are negative. Physical Exam   Blood pressure 112/74, pulse 99, temperature 97.8 F (36.6 C), temperature source Oral, resp. rate 19, height 5\' 6"  (1.676 m), weight 85 kg, SpO2 100 %.  Physical Exam Vitals and nursing note reviewed. Exam conducted with a chaperone present.  Constitutional:      Appearance: She  is well-developed. She is ill-appearing.  Cardiovascular:     Rate and Rhythm: Normal rate and regular rhythm.     Heart sounds: Normal heart sounds.  Pulmonary:     Effort: Pulmonary effort is normal.     Breath sounds: Normal breath sounds.  Abdominal:     Palpations: Abdomen is soft.     Tenderness: There is no abdominal tenderness.     Comments: Gravid  Genitourinary:    Comments: Pelvic exam: External genitalia normal, vaginal walls pink and well rugated, cervix visually closed, no lesions noted.    Skin:    Capillary Refill: Capillary refill takes less than 2 seconds.  Neurological:     Mental Status: She is alert and oriented to person, place, and time.  Psychiatric:        Mood and Affect: Mood normal.        Behavior: Behavior normal.     MAU Course/MDM  Procedures  --Reactive tracing: baseline 145, mod var, + accels, no decels --Toco: irregular q 2-5 min prior to intervention, resolved with fluids --Questionable decel with good variability at 1935. Occurred immediately after patient repositioned abruptly. Likely tracing maternal but will order BPP --Closed cervix confirmed with digital exam prior to discharge --Ketonuria correct with IV fluids including MVI  Orders Placed This Encounter  Procedures   Resp Panel by RT-PCR (Flu A&B, Covid) Nasopharyngeal Swab   [place] MFM Fetal BPP Wo Non Stress   Urinalysis, Routine w reflex microscopic Urine, Clean Catch   Amnisure rupture of membrane (rom)not at [facility]   CBC with Differential/Platelet   Comprehensive metabolic panel   Fern Test   Insert peripheral IV   Patient Vitals for the past 24 hrs:  BP Temp Temp src Pulse Resp SpO2 Height Weight  02/25/21 1930 -- -- -- -- -- 100 % -- --  02/25/21 1813 112/74 -- -- -- -- -- -- --  02/25/21 1732 115/68 -- -- 99 -- 98 % -- --  02/25/21 1717 117/68 97.8 F (36.6 C) Oral 100 19 98 % -- --  02/25/21 1711 -- -- -- -- -- -- 5\' 6"  (1.676 m) 85 kg   Meds ordered this encounter  Medications   lactated ringers bolus 1,000 mL   NIFEdipine (PROCARDIA) capsule 10 mg   ondansetron (ZOFRAN-ODT) disintegrating tablet 4 mg   M.V.I. Adult (INFUVITE ADULT) 10 mL in lactated ringers 1,000 mL infusion    80 ketones in urine, recurrent vomiting   ondansetron (ZOFRAN) injection 4 mg   ondansetron (ZOFRAN-ODT) 4 MG disintegrating tablet    Sig: Take 1 tablet (4 mg total) by mouth every 6 (six) hours as needed for nausea.    Dispense:  20 tablet    Refill:  0    Order Specific Question:   Supervising Provider    Answer:   , MICHAEL L [[id number]]   scopolamine (TRANSDERM-SCOP) 1 MG/3DAYS    Sig: Place 1 patch (1.5 mg total) onto the skin every 3 (three) days.    Dispense:  10 patch    Refill:  12    Order Specific Question:   Supervising  Provider    Answer:   Alysia Penna L [[id number]]   Assessment and Plan  --26 y.o. G1P0000 at [redacted]w[redacted]d  --Reactive tracing --BPP 8/8 --Closed cervix --Possible GI virus, outpatient antiemetic regimen --Pain score 0/10 prior to discharge --Discharge home in stable condition  [redacted]w[redacted]d, CNM 02/25/2021, 9:38 PM

## 2021-02-25 NOTE — MAU Note (Signed)
Presents with c/o N/V since 1130 this morning and is also having intermittent mid abdominal pain that's both sharp and cramping.  Denies VB, states each time she vomits fluid comes out.  Endorses +FM.

## 2021-03-02 ENCOUNTER — Inpatient Hospital Stay (HOSPITAL_COMMUNITY): Payer: BC Managed Care – PPO

## 2021-03-02 ENCOUNTER — Encounter (HOSPITAL_COMMUNITY): Payer: Self-pay | Admitting: Obstetrics and Gynecology

## 2021-03-02 ENCOUNTER — Other Ambulatory Visit: Payer: Self-pay

## 2021-03-02 ENCOUNTER — Inpatient Hospital Stay (HOSPITAL_COMMUNITY)
Admission: AD | Admit: 2021-03-02 | Discharge: 2021-03-02 | Disposition: A | Discharge status: Home / Self Care | Payer: BC Managed Care – PPO | Attending: Obstetrics and Gynecology | Admitting: Obstetrics and Gynecology

## 2021-03-02 DIAGNOSIS — R0789 Other chest pain: Secondary | ICD-10-CM | POA: Diagnosis not present

## 2021-03-02 DIAGNOSIS — O212 Late vomiting of pregnancy: Secondary | ICD-10-CM | POA: Insufficient documentation

## 2021-03-02 DIAGNOSIS — Z3A32 32 weeks gestation of pregnancy: Secondary | ICD-10-CM | POA: Diagnosis not present

## 2021-03-02 DIAGNOSIS — R109 Unspecified abdominal pain: Secondary | ICD-10-CM | POA: Insufficient documentation

## 2021-03-02 DIAGNOSIS — R42 Dizziness and giddiness: Secondary | ICD-10-CM | POA: Diagnosis not present

## 2021-03-02 DIAGNOSIS — O26893 Other specified pregnancy related conditions, third trimester: Secondary | ICD-10-CM

## 2021-03-02 DIAGNOSIS — R0602 Shortness of breath: Secondary | ICD-10-CM | POA: Diagnosis not present

## 2021-03-02 LAB — CBC WITH DIFFERENTIAL/PLATELET
Abs Immature Granulocytes: 0.12 10*3/uL — ABNORMAL HIGH (ref 0.00–0.07)
Basophils Absolute: 0 10*3/uL (ref 0.0–0.1)
Basophils Relative: 0 %
Eosinophils Absolute: 0 10*3/uL (ref 0.0–0.5)
Eosinophils Relative: 1 %
HCT: 37.6 % (ref 36.0–46.0)
Hemoglobin: 13.3 g/dL (ref 12.0–15.0)
Immature Granulocytes: 2 %
Lymphocytes Relative: 18 %
Lymphs Abs: 1.3 10*3/uL (ref 0.7–4.0)
MCH: 32 pg (ref 26.0–34.0)
MCHC: 35.4 g/dL (ref 30.0–36.0)
MCV: 90.4 fL (ref 80.0–100.0)
Monocytes Absolute: 0.5 10*3/uL (ref 0.1–1.0)
Monocytes Relative: 7 %
Neutro Abs: 5.5 10*3/uL (ref 1.7–7.7)
Neutrophils Relative %: 72 %
Platelets: 228 10*3/uL (ref 150–400)
RBC: 4.16 MIL/uL (ref 3.87–5.11)
RDW: 12.3 % (ref 11.5–15.5)
WBC: 7.5 10*3/uL (ref 4.0–10.5)
nRBC: 0 % (ref 0.0–0.2)

## 2021-03-02 LAB — TROPONIN I (HIGH SENSITIVITY)
Troponin I (High Sensitivity): 3 ng/L (ref <18)
Troponin I (High Sensitivity): 3 ng/L (ref <18)

## 2021-03-02 LAB — COMPREHENSIVE METABOLIC PANEL
ALT: 34 U/L (ref 0–44)
AST: 29 U/L (ref 15–41)
Albumin: 3 g/dL — ABNORMAL LOW (ref 3.5–5.0)
Alkaline Phosphatase: 69 U/L (ref 38–126)
Anion gap: 8 (ref 5–15)
BUN: <5 mg/dL — ABNORMAL LOW (ref 6–20)
CO2: 21 mmol/L — ABNORMAL LOW (ref 22–32)
Calcium: 9 mg/dL (ref 8.9–10.3)
Chloride: 104 mmol/L (ref 98–111)
Creatinine, Ser: 0.53 mg/dL (ref 0.44–1.00)
GFR, Estimated: >60 mL/min (ref >60)
Glucose, Bld: 93 mg/dL (ref 70–99)
Potassium: 4 mmol/L (ref 3.5–5.1)
Sodium: 133 mmol/L — ABNORMAL LOW (ref 135–145)
Total Bilirubin: 0.4 mg/dL (ref 0.3–1.2)
Total Protein: 6.1 g/dL — ABNORMAL LOW (ref 6.5–8.1)

## 2021-03-02 LAB — URINALYSIS, ROUTINE W REFLEX MICROSCOPIC
Bilirubin Urine: NEGATIVE
Glucose, UA: NEGATIVE mg/dL
Hgb urine dipstick: NEGATIVE
Ketones, ur: NEGATIVE mg/dL
Leukocytes,Ua: NEGATIVE
Nitrite: NEGATIVE
Protein, ur: NEGATIVE mg/dL
Specific Gravity, Urine: 1.01 (ref 1.005–1.030)
pH: 7 (ref 5.0–8.0)

## 2021-03-02 LAB — BRAIN NATRIURETIC PEPTIDE: B Natriuretic Peptide: 11 pg/mL (ref 0.0–100.0)

## 2021-03-02 MED ORDER — IOHEXOL 350 MG/ML SOLN
80.0000 mL | Freq: Once | INTRAVENOUS | Status: AC | PRN
  Administered 2021-03-02: 80 mL via INTRAVENOUS

## 2021-03-02 MED ORDER — ACETAMINOPHEN 500 MG PO TABS
1000.0000 mg | ORAL_TABLET | Freq: Once | ORAL | Status: AC
  Administered 2021-03-02: 1000 mg via ORAL
  Filled 2021-03-02: qty 2

## 2021-03-02 MED ORDER — LACTATED RINGERS IV BOLUS
1000.0000 mL | Freq: Once | INTRAVENOUS | Status: AC
  Administered 2021-03-02: 1000 mL via INTRAVENOUS

## 2021-03-02 NOTE — MAU Note (Signed)
CT notified of new IV access. They will call for transport.  Pt updated.  States is feeling SOB now, seems to have flared up. Was going to remove fetal monitor (per Dr Higinio Plan), pt expressed concern for what it does to baby.  Will leave on monitor for now.

## 2021-03-02 NOTE — MAU Note (Signed)
CT notified of inability to establish IV in [facility].  Pt refusing another stick, will forego CT if unable to use est IV in hand.  CT states are unable to use that site.  Dr Annia Friendly notified of above.

## 2021-03-02 NOTE — MAU Provider Note (Signed)
MAU Provider Note   History     CSN: [id number]  Arrival date and time: 03/02/21 0720  Chief Complaint  Patient presents with   Chest Tightness   Shortness of Breath   April Nicholson is a 26 yo G2P0010 at [redacted]w[redacted]d presenting for evaluation of chest pain and shortness of breath.   She reports she woke up around 2:15 AM this morning due to her chest feeling tight.  Because of this, reports that she had difficulty catching her breath.  She felt lightheaded with walking but went downstairs and had some water to drink.  The difficulty breathing sensation improved, however she still had tightness and so sought care.  Since this morning she has had 2 episodes of watery brown bowel movements.  She denies any associated nausea/vomiting, abdominal pain, palpitations, contraction-like pain, vaginal bleeding, coughing, fever, congestion, dysuria, or leakage of fluid.  No one else sick at home.  Normal fetal movement.  No cardiac history or history of asthma/wheezing.  Of note, she was recently seen in the MAU this past Sunday for new onset N/V with abdominal pain.  COVID/flu negative then.  She reports that she was feeling better however started having the symptoms as above today.  Thought to be viral in nature.  She [facility], reports uncomplicated pregnancy.  Past Medical History:  Diagnosis Date   Medical history non-contributory     Past Surgical History:  Procedure Laterality Date   NO PAST SURGERIES      Family History  Problem Relation Age of Onset   Healthy Mother    Healthy Father    Asthma Maternal Grandmother    Hypertension Maternal Grandmother     Social History   Tobacco Use   Smoking status: Never   Smokeless tobacco: Never  Vaping Use   Vaping Use: Never used  Substance Use Topics   Alcohol use: No   Drug use: No    Allergies:  Allergies  Allergen Reactions   Compazine [Prochlorperazine Edisylate] Other (See Comments)     seizure    Medications Prior to Admission  Medication Sig Dispense Refill Last Dose   Prenatal Vit-Fe Fumarate-FA (PRENATAL MULTIVITAMIN) TABS tablet Take 1 tablet by mouth daily at 12 noon.   03/01/2021   pyridoxine (B-6) 100 MG tablet Take 100 mg by mouth daily.   03/01/2021   scopolamine (TRANSDERM-SCOP) 1 MG/3DAYS Place 1 patch (1.5 mg total) onto the skin every 3 (three) days. 10 patch 12 Past Week   loratadine (CLARITIN) 10 MG tablet Take 10 mg by mouth daily.   More than a month   ondansetron (ZOFRAN-ODT) 4 MG disintegrating tablet Take 1 tablet (4 mg total) by mouth every 6 (six) hours as needed for nausea. 20 tablet 0     Review of Systems  Constitutional:  Negative for fatigue and fever.  Respiratory:  Positive for chest tightness and shortness of breath. Negative for apnea, cough, choking, wheezing and stridor.   Cardiovascular:  Negative for palpitations and leg swelling.  Gastrointestinal:  Positive for diarrhea. Negative for abdominal pain, constipation, nausea and vomiting.  Genitourinary:  Negative for decreased urine volume, dysuria, vaginal bleeding and vaginal discharge.  Skin:  Negative for pallor and rash.  Neurological:  Positive for light-headedness. Negative for dizziness, weakness, numbness and headaches.  Psychiatric/Behavioral:  Negative for behavioral problems.   Physical Exam   Blood pressure 118/73, pulse (!) 102, temperature 98 F (36.7 C), temperature source Oral, resp. rate  18, SpO2 100 %.  Physical Exam Constitutional:      General: She is not in acute distress.    Appearance: She is well-developed. She is not ill-appearing or diaphoretic.     Comments: Appears very comfortable sitting up in bed.  HENT:     Head: Normocephalic and atraumatic.     Mouth/Throat:     Mouth: Mucous membranes are moist.  Eyes:     Extraocular Movements: Extraocular movements intact.  Cardiovascular:     Rate and Rhythm: Normal rate and regular rhythm.     Pulses: Normal  pulses.     Heart sounds: No murmur heard.   No friction rub. No gallop.  Pulmonary:     Effort: Pulmonary effort is normal.     Breath sounds: Normal breath sounds.     Comments: Lung sounds clear anterior/posterior bilaterally without wheezing or crackles.  Normal work of breathing, sitting very comfortably.  Able to speak in full sentences without difficulty.  Pulse ox 100%.  Chest:     Chest wall: No mass, tenderness or edema.  Abdominal:     Palpations: Abdomen is soft.     Comments: Gravid appropriate for gestational age  Musculoskeletal:     Right lower leg: No edema.     Left lower leg: No edema.  Skin:    General: Skin is warm and dry.     Capillary Refill: Capillary refill takes less than 2 seconds.  Neurological:     General: No focal deficit present.     Mental Status: She is alert and oriented to person, place, and time.  Psychiatric:        Behavior: Behavior normal.    MAU Course   NST  Baseline 150bpm Moderate variability 15x15 accelerations  No decels  On monitor >6 hours with brief few ctx (Difficult to assess, many times she was noted to be moving around causing toco changes, asymptomatic).    MDM Chest tightness + SOB with reassuring exam.  Will obtain CXR, CBC, CMP, BNP, troponin.  EKG obtained and NSR, nonspecific T wave change in lead III likely normal variant.  Give IV fluid bolus with Tylenol 1000 mg and reassess. U/A unremarkable.  Reassessed 1015: Reports she feels relatively unchanged since IV fluid bolus and Tylenol.  CBC returned WNL and initial troponin negative at 3.  Remainder of labs pending.   Reassessed 1130: Checked in on patient.  Reports she feels improved from when this discomfort initially started at home, however still having chest tightness.  BNP WNL and CMP unremarkable. 2nd troponin negative. Spoke with Dr. Damita Dunnings, recommends proceeding with CT Chest Angio to rule out PE.  D-dimer unlikely to change management.   1250: Attempted IV  twice for CT PE, however can not get placed.  Patient is frustrated and would like the IV team to try one additional time.  If IV unable to be placed after this attempt, patient will decline further attempts.  1345: IV team able to place IV.  Reported to RN feeling the difficulty breathing return again that lasted for about 5 minutes and resolved on her own with relaxation. VSS and appeared comfortable at this time per RN. To CT. Feeling better after return from CT but not at baseline, ready to eat and rest at home.   Assessment and Plan   1. Chest tightness 2. SOB (shortness of breath) Unclear etiology with some improvement at discharge.  Benign cardiopulmonary exam, appeared comfortable for duration of evaluation.  Stable VS with normal pulse ox.  PNA, PE, HF, pleural/cardiac effusion, arrhythmia, and MI/ACS effectively ruled out with reassuring EKG, negative troponin X2, BNP WNL, negative CXR/CT chest angio. COVID/flu swab negative a few days ago.  No wheezing or history of pulmonary concerns to suggest asthma exacerbation.  May be related to viral illness from earlier this week, could also consider costochondritis from recent emesis but without tenderness on exam.  Tylenol, adequate hydration, avoiding laying on back, and slow position changes at home.  3. [redacted] weeks gestation of pregnancy Reactive NST for duration of evaluation.  Normal fetal movement.  Patient reports follow-up with OB on 1/11.  Strict MAU precautions discussed especially if chest tightness persists/worsens with increasing difficulty breathing in addition to any obstetric concern including leakage of fluid, vaginal bleeding, decreased fetal movement, persistent contractions etc.    Patriciaann Clan 03/02/2021, 8:05 AM

## 2021-03-02 NOTE — MAU Note (Signed)
Presents with c/o chest tightness and SOB, states symptoms began Sunday while at the hospital after receiving Zofran.  States symptoms subsided that same evening but returned this morning @ 0215.  Also reports being dizzy and feeling like she's "going to pass out", hasn't actually passed out.  Denies VB or LOF.  Endorses +FM.

## 2021-03-02 NOTE — MAU Note (Signed)
Pt resistant to being stuck again, will give her more time

## 2021-03-02 NOTE — Discharge Instructions (Signed)
It was wonderful to see you today.  If your chest tightness persists or is getting worse, worsening difficulty breathing, persistent contractions, vaginal bleeding, leakage of fluid, or decreased fetal movement please return to the MAU.  Make sure that you drink plenty of fluids.  You can take Tylenol up to 1000 mg at 1 time and up to 4000 mg total in a day.  Take your time when you move from sitting to standing.

## 2021-03-02 NOTE — MAU Note (Addendum)
Denies nausea/vomiting, that has stopped.  Denies cough or sore throat.  No OB complaints.

## 2021-03-02 NOTE — MAU Note (Signed)
Feeling some tightening in abd, only one or two have been painful, like a 2/10. Still reports tightness in chest, not painful.  No resp distress noted.

## 2021-03-29 DIAGNOSIS — Z363 Encounter for antenatal screening for malformations: Secondary | ICD-10-CM | POA: Diagnosis not present

## 2021-03-29 LAB — OB RESULTS CONSOLE GBS: GBS: NEGATIVE

## 2021-04-19 ENCOUNTER — Telehealth (HOSPITAL_COMMUNITY): Payer: Self-pay | Admitting: *Deleted

## 2021-04-19 NOTE — Telephone Encounter (Signed)
Preadmission screen  

## 2021-04-20 ENCOUNTER — Telehealth (HOSPITAL_COMMUNITY): Payer: Self-pay | Admitting: *Deleted

## 2021-04-20 NOTE — Telephone Encounter (Signed)
Preadmission screen  

## 2021-04-23 ENCOUNTER — Telehealth (HOSPITAL_COMMUNITY): Payer: Self-pay | Admitting: *Deleted

## 2021-04-23 DIAGNOSIS — O481 Prolonged pregnancy: Secondary | ICD-10-CM | POA: Diagnosis not present

## 2021-04-23 NOTE — Telephone Encounter (Signed)
Preadmission screen  

## 2021-04-24 ENCOUNTER — Encounter (HOSPITAL_COMMUNITY): Payer: Self-pay | Admitting: *Deleted

## 2021-04-24 ENCOUNTER — Telehealth (HOSPITAL_COMMUNITY): Payer: Self-pay | Admitting: *Deleted

## 2021-04-24 ENCOUNTER — Encounter (HOSPITAL_COMMUNITY): Payer: Self-pay | Admitting: Obstetrics & Gynecology

## 2021-04-24 NOTE — Telephone Encounter (Signed)
Preadmission screen  

## 2021-04-25 ENCOUNTER — Encounter (HOSPITAL_COMMUNITY): Payer: Self-pay | Admitting: Obstetrics and Gynecology

## 2021-04-25 ENCOUNTER — Other Ambulatory Visit: Payer: Self-pay | Admitting: Obstetrics & Gynecology

## 2021-04-25 ENCOUNTER — Other Ambulatory Visit: Payer: Self-pay

## 2021-04-25 ENCOUNTER — Inpatient Hospital Stay (HOSPITAL_COMMUNITY)
Admission: AD | Admit: 2021-04-25 | Discharge: 2021-04-28 | DRG: 807 | Disposition: A | Discharge status: Home / Self Care | Payer: BC Managed Care – PPO | Attending: Obstetrics and Gynecology | Admitting: Obstetrics and Gynecology

## 2021-04-25 DIAGNOSIS — O480 Post-term pregnancy: Principal | ICD-10-CM | POA: Diagnosis present

## 2021-04-25 DIAGNOSIS — Z3490 Encounter for supervision of normal pregnancy, unspecified, unspecified trimester: Secondary | ICD-10-CM

## 2021-04-25 DIAGNOSIS — Z3A40 40 weeks gestation of pregnancy: Secondary | ICD-10-CM

## 2021-04-25 LAB — SARS CORONAVIRUS 2 (TAT 6-24 HRS): SARS Coronavirus 2: NEGATIVE

## 2021-04-25 NOTE — MAU Note (Signed)
Patient presented to MAU with contractions that she states is 2-3 mins apart. She is currently rating the pain a 3/10. Cxt started at 1630 today but became closer together around 1930. Pt denies leaking of fluid as well as no bloody show, but states she does feel vaginal pressure.  ?

## 2021-04-26 ENCOUNTER — Inpatient Hospital Stay (HOSPITAL_COMMUNITY): Payer: BC Managed Care – PPO | Admitting: Anesthesiology

## 2021-04-26 ENCOUNTER — Encounter (HOSPITAL_COMMUNITY): Payer: Self-pay | Admitting: Obstetrics and Gynecology

## 2021-04-26 DIAGNOSIS — O26893 Other specified pregnancy related conditions, third trimester: Secondary | ICD-10-CM | POA: Diagnosis not present

## 2021-04-26 DIAGNOSIS — O480 Post-term pregnancy: Secondary | ICD-10-CM | POA: Diagnosis not present

## 2021-04-26 DIAGNOSIS — Z3490 Encounter for supervision of normal pregnancy, unspecified, unspecified trimester: Secondary | ICD-10-CM

## 2021-04-26 DIAGNOSIS — Z3A40 40 weeks gestation of pregnancy: Secondary | ICD-10-CM | POA: Diagnosis not present

## 2021-04-26 LAB — CBC
HCT: 42.7 % (ref 36.0–46.0)
Hemoglobin: 15 g/dL (ref 12.0–15.0)
MCH: 32.4 pg (ref 26.0–34.0)
MCHC: 35.1 g/dL (ref 30.0–36.0)
MCV: 92.2 fL (ref 80.0–100.0)
Platelets: 270 10*3/uL (ref 150–400)
RBC: 4.63 MIL/uL (ref 3.87–5.11)
RDW: 13.3 % (ref 11.5–15.5)
WBC: 9.5 10*3/uL (ref 4.0–10.5)
nRBC: 0 % (ref 0.0–0.2)

## 2021-04-26 LAB — TYPE AND SCREEN
ABO/RH(D): O POS
Antibody Screen: NEGATIVE

## 2021-04-26 LAB — RPR: RPR Ser Ql: NONREACTIVE

## 2021-04-26 MED ORDER — MISOPROSTOL 25 MCG QUARTER TABLET
25.0000 ug | ORAL_TABLET | ORAL | Status: DC | PRN
  Administered 2021-04-26 (×2): 25 ug via VAGINAL
  Filled 2021-04-26 (×2): qty 1

## 2021-04-26 MED ORDER — LIDOCAINE HCL (PF) 1 % IJ SOLN
INTRAMUSCULAR | Status: DC | PRN
  Administered 2021-04-26: 10 mL via EPIDURAL

## 2021-04-26 MED ORDER — OXYCODONE-ACETAMINOPHEN 5-325 MG PO TABS: 2.0000 | ORAL_TABLET | ORAL | Status: DC | PRN

## 2021-04-26 MED ORDER — DIPHENHYDRAMINE HCL 50 MG/ML IJ SOLN: 12.5000 mg | INTRAMUSCULAR | Status: DC | PRN

## 2021-04-26 MED ORDER — TERBUTALINE SULFATE 1 MG/ML IJ SOLN
0.2500 mg | Freq: Once | INTRAMUSCULAR | Status: DC | PRN
  Filled 2021-04-26: qty 1

## 2021-04-26 MED ORDER — FENTANYL CITRATE (PF) 100 MCG/2ML IJ SOLN
100.0000 ug | Freq: Once | INTRAMUSCULAR | Status: AC
  Administered 2021-04-26: 100 ug via INTRAVENOUS

## 2021-04-26 MED ORDER — LACTATED RINGERS IV SOLN: 500.0000 mL | INTRAVENOUS | Status: DC | PRN

## 2021-04-26 MED ORDER — LACTATED RINGERS IV SOLN: INTRAVENOUS | Status: DC

## 2021-04-26 MED ORDER — SOD CITRATE-CITRIC ACID 500-334 MG/5ML PO SOLN: 30.0000 mL | ORAL | Status: DC | PRN

## 2021-04-26 MED ORDER — OXYTOCIN BOLUS FROM INFUSION
333.0000 mL | Freq: Once | INTRAVENOUS | Status: AC
  Administered 2021-04-27: 333 mL via INTRAVENOUS

## 2021-04-26 MED ORDER — TERBUTALINE SULFATE 1 MG/ML IJ SOLN: 0.2500 mg | Freq: Once | INTRAMUSCULAR | Status: DC | PRN

## 2021-04-26 MED ORDER — EPHEDRINE 5 MG/ML INJ: 10.0000 mg | INTRAVENOUS | Status: DC | PRN

## 2021-04-26 MED ORDER — LIDOCAINE HCL (PF) 1 % IJ SOLN: 30.0000 mL | INTRAMUSCULAR | Status: DC | PRN

## 2021-04-26 MED ORDER — OXYTOCIN-SODIUM CHLORIDE 30-0.9 UT/500ML-% IV SOLN: 1.0000 m[IU]/min | INTRAVENOUS | Status: DC

## 2021-04-26 MED ORDER — PHENYLEPHRINE 40 MCG/ML (10ML) SYRINGE FOR IV PUSH (FOR BLOOD PRESSURE SUPPORT): 80.0000 ug | PREFILLED_SYRINGE | INTRAVENOUS | Status: DC | PRN

## 2021-04-26 MED ORDER — ACETAMINOPHEN 325 MG PO TABS: 650.0000 mg | ORAL_TABLET | ORAL | Status: DC | PRN

## 2021-04-26 MED ORDER — BUPIVACAINE HCL (PF) 0.25 % IJ SOLN
INTRAMUSCULAR | Status: DC | PRN
  Administered 2021-04-26: 8 mL via EPIDURAL

## 2021-04-26 MED ORDER — LACTATED RINGERS IV SOLN: 500.0000 mL | Freq: Once | INTRAVENOUS | Status: DC

## 2021-04-26 MED ORDER — OXYTOCIN-SODIUM CHLORIDE 30-0.9 UT/500ML-% IV SOLN
2.5000 [IU]/h | INTRAVENOUS | Status: DC
  Administered 2021-04-27: 2.5 [IU]/h via INTRAVENOUS
  Filled 2021-04-26: qty 500

## 2021-04-26 MED ORDER — FENTANYL-BUPIVACAINE-NACL 0.5-0.125-0.9 MG/250ML-% EP SOLN
12.0000 mL/h | EPIDURAL | Status: DC | PRN
  Administered 2021-04-26: 12 mL/h via EPIDURAL
  Filled 2021-04-26: qty 250

## 2021-04-26 MED ORDER — OXYTOCIN-SODIUM CHLORIDE 30-0.9 UT/500ML-% IV SOLN
1.0000 m[IU]/min | INTRAVENOUS | Status: DC
  Administered 2021-04-26: 2 m[IU]/min via INTRAVENOUS

## 2021-04-26 MED ORDER — FENTANYL CITRATE (PF) 100 MCG/2ML IJ SOLN
INTRAMUSCULAR | Status: AC
  Filled 2021-04-26: qty 2

## 2021-04-26 MED ORDER — OXYCODONE-ACETAMINOPHEN 5-325 MG PO TABS: 1.0000 | ORAL_TABLET | ORAL | Status: DC | PRN

## 2021-04-26 MED ORDER — ONDANSETRON HCL 4 MG/2ML IJ SOLN: 4.0000 mg | Freq: Four times a day (QID) | INTRAMUSCULAR | Status: DC | PRN

## 2021-04-26 MED ORDER — PHENYLEPHRINE 40 MCG/ML (10ML) SYRINGE FOR IV PUSH (FOR BLOOD PRESSURE SUPPORT)
80.0000 ug | PREFILLED_SYRINGE | INTRAVENOUS | Status: DC | PRN
  Filled 2021-04-26: qty 10

## 2021-04-26 NOTE — Progress Notes (Signed)
Small change following second dose of cytotec. ?Discussed doing third dose or placement of foley balloon. She accept foley ? ?Standard foley balloon placed without difficulty.  Patient tolerated well.  Cervix 1/70/-2.  Will titrate pitocin with foley in place, hold at 10 mU/min. ? ?All questions answered.   ? ?Nilda Simmer MD ?

## 2021-04-26 NOTE — Progress Notes (Signed)
Labor Progress Note ? ?Since last note, foley has been out and epidural placed.  She was agreeable to pitocin titration and currently at 8 mu/min with q2-3 min contractions.  Brief period of recurrent subtle late decels were resolved with position changes.   ? ?Cervix 4-5 / 80 / -2.  AROM performed in typical fashion with return of clear fluid. Bloody show noted on exam.  Head was well applied.  Some possible early decels following. Moderate variability.  ? ?Discussed ongoing IOL course with patient, will continue current management. ? ?Nilda Simmer ?

## 2021-04-26 NOTE — Progress Notes (Signed)
Explained to pt that we need to go up on pitocin. Pt states she will think about it. ?

## 2021-04-26 NOTE — H&P (Signed)
OB History and Physical ? ? ?April Nicholson is a 26 y.o. female G2P0010 presenting for contractions at [redacted]w[redacted]d. She had plans for  IOL on 3/3 and was kept for her intense contractions.  After admission, her contractions slowed and decreased in intensity. She was given misoprostol 0455 and again at 0900. ? ?Pregnancy course has been uncomplicated.  Rh positive, GBS negative. ? ? ?OB History   ? ? Gravida  ?2  ? Para  ?0  ? Term  ?0  ? Preterm  ?0  ? AB  ?1  ? Living  ?0  ?  ? ? SAB  ?1  ? IAB  ?0  ? Ectopic  ?0  ? Multiple  ?0  ? Live Births  ?0  ?   ?  ?  ? ?History reviewed. No pertinent past medical history. ?Past Surgical History:  ?Procedure Laterality Date  ? NO PAST SURGERIES    ? ?Family History: family history includes Asthma in her maternal grandmother; Healthy in her father and mother; Hypertension in her maternal grandmother. ?Social History:  reports that she has never smoked. She has never used smokeless tobacco. She reports that she does not drink alcohol and does not use drugs. ? ? ?  ?Maternal Diabetes: No ?Genetic Screening: Declined ?Maternal Ultrasounds/Referrals: Normal ?Fetal Ultrasounds or other Referrals:  None ?Maternal Substance Abuse:  No ?Significant Maternal Medications:  None ?Significant Maternal Lab Results:  Group B Strep negative ?Other Comments:  None ? ?Review of Systems - Patient denies fever, chills, SOB, CP, N/V/D.  ?History ?Dilation: 1 ?Effacement (%): 50 ?Station: -2 ?Exam by:: Penelope Coop RN ?Blood pressure 120/75, pulse 83, temperature 98.1 ?F (36.7 ?C), temperature source Oral, resp. rate 16, height 5\' 6"  (1.676 m), weight 86 kg, SpO2 99 %. ?Exam ?Physical Exam  ? ?Gen: alert, well appearing, no distress ?Chest: nonlabored breathing ?CV: no peripheral edema ?Abdomen: soft, gravid, ctx present ?Ext: no evidence of DVT ? ?Prenatal labs: ?ABO, Rh: --/--/O POS (03/02 0111) ?Antibody: NEG (03/02 0111) ?Rubella: Immune (08/03 0000) ?RPR: Nonreactive (08/03 0000)  ?HBsAg:  Negative (08/03 0000)  ?HIV: Non-reactive (08/03 0000)  ?GBS: Negative/-- (02/02 0000)  ? ?Assessment/Plan: ?S/p 2x cytotec, may need third dose. Also discussed foley balloon. ?Epidural when desired ?Anticipate vaginal delivery.   ? ?12-23-1984 ?04/26/2021, 7:22 AM ? ? ? ? ?

## 2021-04-26 NOTE — Anesthesia Preprocedure Evaluation (Signed)
Anesthesia Evaluation  ?Patient identified by MRN, date of birth, ID band ?Patient awake ? ? ? ?Reviewed: ?Allergy & Precautions, H&P , NPO status , Patient's Chart, lab work & pertinent test results ? ?Airway ?Mallampati: I ? ? ? ? ? ? Dental ?no notable dental hx. ? ?  ?Pulmonary ?neg pulmonary ROS,  ?  ?Pulmonary exam normal ? ? ? ? ? ? ? Cardiovascular ?negative cardio ROS ?Normal cardiovascular exam ? ? ?  ?Neuro/Psych ?negative neurological ROS ? negative psych ROS  ? GI/Hepatic ?negative GI ROS, Neg liver ROS,   ?Endo/Other  ?negative endocrine ROS ? Renal/GU ?negative Renal ROS  ? ?  ?Musculoskeletal ?negative musculoskeletal ROS ?(+)  ? Abdominal ?Normal abdominal exam  (+)   ?Peds ? Hematology ?negative hematology ROS ?(+)   ?Anesthesia Other Findings ? ? Reproductive/Obstetrics ?(+) Pregnancy ? ?  ? ? ? ? ? ? ? ? ? ? ? ? ? ?  ?  ? ? ? ? ? ? ? ? ?Anesthesia Physical ?Anesthesia Plan ? ?ASA: 2 ? ?Anesthesia Plan: Epidural  ? ?Post-op Pain Management:   ? ?Induction:  ? ?PONV Risk Score and Plan:  ? ?Airway Management Planned:  ? ?Additional Equipment:  ? ?Intra-op Plan:  ? ?Post-operative Plan:  ? ?Informed Consent: I have reviewed the patients History and Physical, chart, labs and discussed the procedure including the risks, benefits and alternatives for the proposed anesthesia with the patient or authorized representative who has indicated his/her understanding and acceptance.  ? ? ? ? ? ?Plan Discussed with:  ? ?Anesthesia Plan Comments:   ? ? ? ? ? ? ?Anesthesia Quick Evaluation ? ?

## 2021-04-26 NOTE — Anesthesia Procedure Notes (Signed)
Epidural ?Patient location during procedure: OB ?Start time: 04/26/2021 5:45 PM ?End time: 04/26/2021 5:49 PM ? ?Staffing ?Anesthesiologist: Leilani Able, MD ?Performed: anesthesiologist  ? ?Preanesthetic Checklist ?Completed: patient identified, IV checked, site marked, risks and benefits discussed, surgical consent, monitors and equipment checked, pre-op evaluation and timeout performed ? ?Epidural ?Patient position: sitting ?Prep: DuraPrep and site prepped and draped ?Patient monitoring: continuous pulse ox and blood pressure ?Approach: midline ?Location: L3-L4 ?Injection technique: LOR air ? ?Needle:  ?Needle type: Tuohy  ?Needle gauge: 17 G ?Needle length: 9 cm and 9 ?Needle insertion depth: 5 cm cm ?Catheter type: closed end flexible ?Catheter size: 19 Gauge ?Catheter at skin depth: 10 cm ?Test dose: negative and Other ? ?Assessment ?Events: blood not aspirated, injection not painful, no injection resistance, no paresthesia and negative IV test ? ?Additional Notes ?Reason for block:procedure for pain ? ? ? ?

## 2021-04-27 ENCOUNTER — Inpatient Hospital Stay (HOSPITAL_COMMUNITY)
Admission: AD | Admit: 2021-04-27 | Payer: BC Managed Care – PPO | Source: Home / Self Care | Admitting: Obstetrics & Gynecology

## 2021-04-27 ENCOUNTER — Encounter (HOSPITAL_COMMUNITY): Payer: Self-pay | Admitting: Obstetrics and Gynecology

## 2021-04-27 ENCOUNTER — Inpatient Hospital Stay (HOSPITAL_COMMUNITY): Payer: BC Managed Care – PPO

## 2021-04-27 MED ORDER — BENZOCAINE-MENTHOL 20-0.5 % EX AERO
1.0000 "application " | INHALATION_SPRAY | CUTANEOUS | Status: DC | PRN
  Administered 2021-04-27: 1 via TOPICAL
  Filled 2021-04-27: qty 56

## 2021-04-27 MED ORDER — LORATADINE 10 MG PO TABS
10.0000 mg | ORAL_TABLET | Freq: Every day | ORAL | Status: DC
  Filled 2021-04-27 (×2): qty 1

## 2021-04-27 MED ORDER — COCONUT OIL OIL
1.0000 "application " | TOPICAL_OIL | Status: DC | PRN
  Administered 2021-04-27: 1 via TOPICAL

## 2021-04-27 MED ORDER — ONDANSETRON HCL 4 MG/2ML IJ SOLN: 4.0000 mg | INTRAMUSCULAR | Status: DC | PRN

## 2021-04-27 MED ORDER — IBUPROFEN 600 MG PO TABS
600.0000 mg | ORAL_TABLET | Freq: Four times a day (QID) | ORAL | Status: DC
  Administered 2021-04-27 – 2021-04-28 (×6): 600 mg via ORAL
  Filled 2021-04-27 (×6): qty 1

## 2021-04-27 MED ORDER — DIBUCAINE (PERIANAL) 1 % EX OINT
1.0000 | TOPICAL_OINTMENT | CUTANEOUS | Status: DC | PRN
Start: 2021-04-27 — End: 2021-04-28

## 2021-04-27 MED ORDER — ZOLPIDEM TARTRATE 5 MG PO TABS: 5.0000 mg | ORAL_TABLET | Freq: Every evening | ORAL | Status: DC | PRN

## 2021-04-27 MED ORDER — TETANUS-DIPHTH-ACELL PERTUSSIS 5-2.5-18.5 LF-MCG/0.5 IM SUSY: 0.5000 mL | PREFILLED_SYRINGE | Freq: Once | INTRAMUSCULAR | Status: DC

## 2021-04-27 MED ORDER — SENNOSIDES-DOCUSATE SODIUM 8.6-50 MG PO TABS
2.0000 | ORAL_TABLET | ORAL | Status: DC
  Administered 2021-04-27 – 2021-04-28 (×2): 2 via ORAL
  Filled 2021-04-27 (×2): qty 2

## 2021-04-27 MED ORDER — DIPHENHYDRAMINE HCL 25 MG PO CAPS: 25.0000 mg | ORAL_CAPSULE | Freq: Four times a day (QID) | ORAL | Status: DC | PRN

## 2021-04-27 MED ORDER — ACETAMINOPHEN 325 MG PO TABS
650.0000 mg | ORAL_TABLET | ORAL | Status: DC | PRN
  Administered 2021-04-27 – 2021-04-28 (×5): 650 mg via ORAL
  Filled 2021-04-27 (×5): qty 2

## 2021-04-27 MED ORDER — METOCLOPRAMIDE HCL 5 MG/ML IJ SOLN
10.0000 mg | Freq: Once | INTRAMUSCULAR | Status: AC
  Administered 2021-04-27: 10 mg via INTRAVENOUS
  Filled 2021-04-27: qty 2

## 2021-04-27 MED ORDER — PRENATAL MULTIVITAMIN CH
1.0000 | ORAL_TABLET | Freq: Every day | ORAL | Status: DC
  Administered 2021-04-27 – 2021-04-28 (×2): 1 via ORAL
  Filled 2021-04-27 (×2): qty 1

## 2021-04-27 MED ORDER — ONDANSETRON HCL 4 MG PO TABS: 4.0000 mg | ORAL_TABLET | ORAL | Status: DC | PRN

## 2021-04-27 MED ORDER — METOCLOPRAMIDE HCL 5 MG/ML IJ SOLN
10.0000 mg | Freq: Four times a day (QID) | INTRAMUSCULAR | Status: DC | PRN
  Administered 2021-04-27: 10 mg via INTRAVENOUS
  Filled 2021-04-27: qty 2

## 2021-04-27 MED ORDER — SIMETHICONE 80 MG PO CHEW: 80.0000 mg | CHEWABLE_TABLET | ORAL | Status: DC | PRN

## 2021-04-27 MED ORDER — WITCH HAZEL-GLYCERIN EX PADS: 1.0000 "application " | MEDICATED_PAD | CUTANEOUS | Status: DC | PRN

## 2021-04-27 NOTE — Anesthesia Postprocedure Evaluation (Signed)
Anesthesia Post Note ? ?Patient: April Nicholson ? ?Procedure(s) Performed: AN AD HOC LABOR EPIDURAL ? ?  ? ?Patient location during evaluation: Mother Baby ?Anesthesia Type: Epidural ?Level of consciousness: awake and alert ?Pain management: pain level controlled ?Vital Signs Assessment: post-procedure vital signs reviewed and stable ?Respiratory status: spontaneous breathing, nonlabored ventilation and respiratory function stable ?Cardiovascular status: stable ?Postop Assessment: no headache, no backache and epidural receding ?Anesthetic complications: no ? ? ?No notable events documented. ? ?Last Vitals:  ?Vitals:  ? 04/27/21 0355 04/27/21 0405  ?BP:  (!) 113/99  ?Pulse:  (!) 164  ?Resp:    ?Temp:    ?SpO2: 100%   ?  ?Last Pain:  ?Vitals:  ? 04/27/21 0405  ?TempSrc:   ?PainSc: 0-No pain  ? ?Pain Goal: Patients Stated Pain Goal: 1 (04/25/21 2304) ? ?  ?  ?  ?  ?  ?  ?Epidural/Spinal Function Cutaneous sensation: Able to Wiggle Toes (04/27/21 0405), Patient able to flex knees: Yes (04/27/21 0405), Patient able to lift hips off bed: Yes (04/27/21 0405), Back pain beyond tenderness at insertion site: No (04/27/21 0405), Progressively worsening motor and/or sensory loss: No (04/27/21 0405), Bowel and/or bladder incontinence post epidural: No (04/27/21 0405) ? ?April Nicholson ? ? ? ? ?

## 2021-04-27 NOTE — Lactation Note (Signed)
This note was copied from a baby's chart. ?Lactation Consultation Note ? ?Patient Name: April Nicholson ?Today's Date: 04/27/2021 ?Reason for consult: Follow-up assessment;Mother's request;RN request;Difficult latch ?Age:28 hours ? ?LC encouraged STS and hand pumping prior to latching.  In football hold, infant could not grasp the breast.  LAid back position tried with pillow support under arms.  Dad assisted in guiding baby to the breast.  LC demonstrated teacup/sandwiching the tissue to help elicit suck. ? ?Baby has a higher palate and has a difficult time grasping the breast.  Once latched infant fed and had rhythmic sucking with multiple swallows.  Mom and dad were taught to listen for these and keep infant active when at the breast. ? ?Mom denied pain and appeared very comfortable.  Dad assisted with latching to the second side.  ? ?Encouraged STS, hand expression and hand pumping prior to latching to evert tissue for easier grasp.  Mom was provided supplementation guidelines and encouraged to hand express or use a hand pump to collect milk to spoon feed back to back today if she is unable to get infant to latch to the breast. ? ?Maternal Data ?  ? ?Feeding ?Mother's Current Feeding Choice: Breast Milk ? ?LATCH Score ?Latch: Repeated attempts needed to sustain latch, nipple held in mouth throughout feeding, stimulation needed to elicit sucking reflex. (difficult time with initiation but once latched sustained it well and continuously sucked) ? ?Audible Swallowing: Spontaneous and intermittent ? ?Type of Nipple: Flat (shorter shaft) ? ?Comfort (Breast/Nipple): Filling, red/small blisters or bruises, mild/mod discomfort (mom denies pain during feeding but does have slight redness and bruising) ? ?Hold (Positioning): Assistance needed to correctly position infant at breast and maintain latch. ? ?LATCH Score: 6 ? ? ?Lactation Tools Discussed/Used ?Breast pump type: Manual ? ?Interventions ?Interventions: Support  pillows;Position options;Adjust position;Breast compression;Skin to skin;Assisted with latch ? ?Discharge ?Pump: Manual ? ?Consult Status ?Consult Status: Follow-up ?Date: 04/28/21 ?Follow-up type: In-patient ? ? ? ?Maryruth Hancock Malia Corsi ?04/27/2021, 3:25 PM ? ? ? ?

## 2021-04-27 NOTE — Lactation Note (Signed)
This note was copied from a baby's chart. ?Lactation Consultation Note ? ?Patient Name: April Nicholson ?Today's Date: 04/27/2021 ?Reason for consult: Initial assessment;Term;Primapara ?Age:28 hours ? ?Mom states baby fed earlier but came on and off during the feeding and did not latch easily. ? ?Mom demonstrates hand expression well.  Several drops of colostrum collected into spoon.  Infant clamped down with suck assessment, colostrum given and tongue extension improved.  Infant latched with LC assistance but multiple attempts needed before sustaining latch. ?Baby had rhythmic sucking and multiple swallows when feeding but mom felt pain was an 8 and LC instructed mom to break latch.   ? ?Attempted on the other side and in multiple positions but mom felt pain immediately when the swallows were heard.   ? ?LC taught spoon feeding and mom returned demo.  Baby had 8 ml of colostrum via spoon.   ? ?LC recommended, hand express, pre pump with manual pump, trying to latch.  If pain is too much, hand express and spoon feed.   ?Encouraged to call out for assistance.   ? ? ?Mom is aware of support groups and OP resources.   ? ?Maternal Data ?Has patient been taught Hand Expression?: Yes ?Does the patient have breastfeeding experience prior to this delivery?: No ? ?Feeding ?Mother's Current Feeding Choice: Breast Milk ? ?LATCH Score ?Latch: Repeated attempts needed to sustain latch, nipple held in mouth throughout feeding, stimulation needed to elicit sucking reflex. ? ?Audible Swallowing: Spontaneous and intermittent ? ?Type of Nipple: Flat (short shaft, taunt tissue, but tissue compressible for teacup hold) ? ?Comfort (Breast/Nipple): Engorged, cracked, bleeding, large blisters, severe discomfort (mom had discomfort and was unable to keep baby latched) ? ?Hold (Positioning): Assistance needed to correctly position infant at breast and maintain latch. ? ?LATCH Score: 5 ? ? ?Lactation Tools Discussed/Used ?Tools: Pump ?Breast  pump type: Manual ?Reason for Pumping: pre pump to evert tissue for easier latch ? ?Interventions ?Interventions: Breast feeding basics reviewed;Assisted with latch;Skin to skin;Hand express;Pre-pump if needed;Adjust position;Expressed milk;Hand pump;Education ? ?Discharge ?Pump: Manual;Personal (lansinoh pump at home) ? ?Consult Status ?Consult Status: Follow-up ?Date: 04/28/21 ?Follow-up type: In-patient ? ? ? ?Maryruth Hancock Lavaya Defreitas ?04/27/2021, 9:52 AM ? ? ? ?

## 2021-04-27 NOTE — Progress Notes (Signed)
Post Partum Day 0 ?Subjective: ?no complaints, up ad lib, voiding, and tolerating PO ? ?Objective: ?Blood pressure 105/67, pulse 92, temperature 97.8 ?F (36.6 ?C), temperature source Oral, resp. rate 18, height 5\' 6"  (1.676 m), weight 86 kg, SpO2 99 %, unknown if currently breastfeeding. ? ?Physical Exam:  ?General: alert and cooperative ?Lochia: appropriate ?Uterine Fundus: firm ?Incision: healing well, no significant drainage ?DVT Evaluation: No evidence of DVT seen on physical exam. ?Negative Homan's sign. ?No cords or calf tenderness. ? ?Recent Labs  ?  04/26/21 ?0111  ?HGB 15.0  ?HCT 42.7  ? ? ?Assessment/Plan: ?Plan for discharge tomorrow, Breastfeeding, and Circumcision prior to discharge ? ? LOS: 1 day  ? ?06/26/21 April Nicholson ?04/27/2021, 9:45 AM  ? ? ?

## 2021-04-27 NOTE — Progress Notes (Signed)
Labor Progress Note ? ?Called to bedside to recurrent variable decelerations. IV fluid and position changes made. ? ?Cervix with rapid progression to complete, 0 station. ? ?Pitocin turned off, will continue position change, allow recovery and labor down.  Anticipate second stage. ? ?Nilda Simmer MD ?

## 2021-04-28 LAB — CBC
HCT: 30.7 % — ABNORMAL LOW (ref 36.0–46.0)
Hemoglobin: 10.6 g/dL — ABNORMAL LOW (ref 12.0–15.0)
MCH: 32.4 pg (ref 26.0–34.0)
MCHC: 34.5 g/dL (ref 30.0–36.0)
MCV: 93.9 fL (ref 80.0–100.0)
Platelets: 164 10*3/uL (ref 150–400)
RBC: 3.27 MIL/uL — ABNORMAL LOW (ref 3.87–5.11)
RDW: 13.5 % (ref 11.5–15.5)
WBC: 9.9 10*3/uL (ref 4.0–10.5)
nRBC: 0 % (ref 0.0–0.2)

## 2021-04-28 MED ORDER — IBUPROFEN 600 MG PO TABS
600.0000 mg | ORAL_TABLET | Freq: Four times a day (QID) | ORAL | 0 refills | Status: AC
Start: 2021-04-28 — End: ?

## 2021-04-28 NOTE — Lactation Note (Signed)
This note was copied from a baby's chart. ?Lactation Consultation Note ? ?Patient Name: Boy Brandilynn Stolz ?Today's Date: 04/28/2021 ?Reason for consult: Follow-up assessment;Term ?Age:28 hours ? ?Mom and dad feel baby is latching well and is satisfied after feedings. ? ?She has no concerns.  Dad is able to assist if mom needs help latching. ? ?DC instructions reviewed and mom is aware of resources after DC. ? ? ? ?Maternal Data ?  ? ?Feeding ?Mother's Current Feeding Choice: Breast Milk ? ?LATCH Score ?Latch: Grasps breast easily, tongue down, lips flanged, rhythmical sucking. ? ?Audible Swallowing: Spontaneous and intermittent ? ?Type of Nipple: Everted at rest and after stimulation ? ?Comfort (Breast/Nipple): Soft / non-tender ? ?Hold (Positioning): Assistance needed to correctly position infant at breast and maintain latch. ? ?LATCH Score: 9 ? ? ?Lactation Tools Discussed/Used ?  ? ?Interventions ?Interventions: Breast feeding basics reviewed;Education ? ?Discharge ?Discharge Education: Engorgement and breast care;Warning signs for feeding baby ? ?Consult Status ?Consult Status: Complete ?Date: 04/28/21 ?Follow-up type: In-patient ? ? ? ?Maryruth Hancock Saide Lanuza ?04/28/2021, 8:59 AM ? ? ? ?

## 2021-04-28 NOTE — Progress Notes (Signed)
Post Partum Day 1 ?Subjective: ?no complaints, up ad lib, voiding, and tolerating PO.  Desires circ and d/c home today. ? ?Objective: ?Blood pressure 107/76, pulse 70, temperature 97.7 ?F (36.5 ?C), temperature source Oral, resp. rate 18, height 5\' 6"  (1.676 m), weight 86 kg, SpO2 98 %, unknown if currently breastfeeding. ? ?Physical Exam:  ?General: alert, cooperative, and appears stated age ?Lochia: appropriate ?Uterine Fundus: firm ?Incision: healing well, no significant drainage, no dehiscence ?DVT Evaluation: No evidence of DVT seen on physical exam. ?Negative Homan's sign. ?No cords or calf tenderness. ? ?Recent Labs  ?  04/26/21 ?0111 04/28/21 ?0442  ?HGB 15.0 10.6*  ?HCT 42.7 30.7*  ? ? ?Assessment/Plan: ?Discharge home, Breastfeeding, and Circumcision prior to discharge ?Circ-counseled re: risk of bleeding, infection and scarring.  All questions were answered and we will proceed. ? ? LOS: 2 days  ? ?06/28/21 Ellison Rieth ?04/28/2021, 8:43 AM  ? ? ?

## 2021-04-28 NOTE — Discharge Instructions (Signed)
Call MD for T>100.4, heavy vaginal bleeding, severe abdominal pain, or respiratory distress.  Call office to schedule postpartum visit in 6 weeks.  Pelvic rest x 6 weeks.   

## 2021-04-28 NOTE — Discharge Summary (Signed)
? ?  Postpartum Discharge Summary ? ?Patient Name: April Nicholson ?DOB: [birth year] ?MRN: [id number] ? ?Date of admission: 04/25/2021 ?Delivery date:04/27/2021  ?Delivering provider: Eyvonne Mechanic A  ?Date of discharge: 04/28/2021 ? ?Admitting diagnosis: Indication for care in labor and delivery, antepartum [O75.9] ?Pregnancy [Z34.90] ?Intrauterine pregnancy: [redacted]w[redacted]d    ?Secondary diagnosis:  Principal Problem: ?  Indication for care in labor and delivery, antepartum ?Active Problems: ?  Pregnancy ? ?Additional problems: none    ?Discharge diagnosis: Term Pregnancy Delivered                                              ?Post partum procedures: none ?Augmentation: AROM, Pitocin, Cytotec, and IP Foley ?Complications: None ? ?Hospital course: Induction of Labor With Vaginal Delivery   ?26y.o. yo G2P1011 at 441w5das admitted to the hospital 04/25/2021 for induction of labor.  Indication for induction: Postdates.  Patient had an uncomplicated labor course as follows: ?Membrane Rupture Time/Date: 8:39 PM ,04/26/2021   ?Delivery Method:Vaginal, Spontaneous  ?Episiotomy: None  ?Lacerations:  1st degree;Perineal  ?Details of delivery can be found in separate delivery note.  Patient had a routine postpartum course. Patient is discharged home 04/28/21. ? ?Newborn Data: ?Birth date:04/27/2021  ?Birth time:3:29 AM  ?Gender:Female  ?Living status:Living  ?Apgars:8 ,9  ?Weight:3330 g  ? ?Magnesium Sulfate received: No ?BMZ received: No ?Rhophylac:No ?MMR:No ?T-DaP:Given prenatally ?Flu: No ?Transfusion:No ? ?Physical exam  ?Vitals:  ? 04/27/21 1507 04/27/21 1745 04/27/21 2012 04/28/21 0606  ?BP: 111/75 116/71 123/77 107/76  ?Pulse:  75 84 70  ?Resp:  20 18 18   ?Temp:  98.4 ?F (36.9 ?C) 98.1 ?F (36.7 ?C) 97.7 ?F (36.5 ?C)  ?TempSrc:  Oral Oral Oral  ?SpO2:   100% 98%  ?Weight:      ?Height:      ? ?General: alert, cooperative, and no distress ?Lochia: appropriate ?Uterine Fundus: firm ?Incision: Healing well with no significant drainage, No  significant erythema ?DVT Evaluation: No evidence of DVT seen on physical exam. ?Negative Homan's sign. ?No cords or calf tenderness. ?Labs: ?Lab Results  ?Component Value Date  ? WBC 9.9 04/28/2021  ? HGB 10.6 (L) 04/28/2021  ? HCT 30.7 (L) 04/28/2021  ? MCV 93.9 04/28/2021  ? PLT 164 04/28/2021  ? ?CMP Latest Ref Rng & Units 03/02/2021  ?Glucose 70 - 99 mg/dL 93  ?BUN 6 - 20 mg/dL <5(L)  ?Creatinine 0.44 - 1.00 mg/dL 0.53  ?Sodium 135 - 145 mmol/L 133(L)  ?Potassium 3.5 - 5.1 mmol/L 4.0  ?Chloride 98 - 111 mmol/L 104  ?CO2 22 - 32 mmol/L 21(L)  ?Calcium 8.9 - 10.3 mg/dL 9.0  ?Total Protein 6.5 - 8.1 g/dL 6.1(L)  ?Total Bilirubin 0.3 - 1.2 mg/dL 0.4  ?Alkaline Phos 38 - 126 U/L 69  ?AST 15 - 41 U/L 29  ?ALT 0 - 44 U/L 34  ? ?Edinburgh Score: ?No flowsheet data found. ? ? ?After visit meds:  ?Allergies as of 04/28/2021   ? ?   Reactions  ? Compazine [prochlorperazine Edisylate] Other (See Comments)  ? seizure  ? ?  ? ?  ?Medication List  ?  ? ?STOP taking these medications   ? ?loratadine 10 MG tablet ?Commonly known as: CLARITIN ?  ?ondansetron 4 MG disintegrating tablet ?Commonly known as: ZOFRAN-ODT ?  ?pyridoxine 100 MG tablet ?Commonly known as: B-6 ?  ?  scopolamine 1 MG/3DAYS ?Commonly known as: TRANSDERM-SCOP ?  ? ?  ? ?TAKE these medications   ? ?ibuprofen 600 MG tablet ?Commonly known as: ADVIL ?Take 1 tablet (600 mg total) by mouth every 6 (six) hours. ?  ?prenatal multivitamin Tabs tablet ?Take 1 tablet by mouth daily at 12 noon. ?  ? ?  ? ? ? ?Discharge home in stable condition ?Infant Feeding: Breast ?Infant Disposition:home with mother ?Discharge instruction: per After Visit Summary and Postpartum booklet. ?Activity: Advance as tolerated. Pelvic rest for 6 weeks.  ?Diet: routine diet ?Future Appointments:No future appointments. ?Follow up Visit:6 weeks PPV ? ?04/28/2021 ?Linda Hedges, DO ? ? ? ?

## 2021-04-30 LAB — BIRTH TISSUE RECOVERY COLLECTION (PLACENTA DONATION)

## 2021-05-08 ENCOUNTER — Telehealth (HOSPITAL_COMMUNITY): Payer: Self-pay | Admitting: *Deleted

## 2021-05-08 NOTE — Telephone Encounter (Signed)
Phone voicemail message left to return nurse call. ? ?Duffy Rhody, RN 05-08-2021 at 2:29pm ?

## 2021-05-30 DIAGNOSIS — Z1389 Encounter for screening for other disorder: Secondary | ICD-10-CM | POA: Diagnosis not present

## 2021-06-18 DIAGNOSIS — B379 Candidiasis, unspecified: Secondary | ICD-10-CM | POA: Diagnosis not present

## 2021-06-18 DIAGNOSIS — B372 Candidiasis of skin and nail: Secondary | ICD-10-CM | POA: Diagnosis not present

## 2021-06-18 DIAGNOSIS — K6289 Other specified diseases of anus and rectum: Secondary | ICD-10-CM | POA: Diagnosis not present

## 2021-11-23 DIAGNOSIS — Z13220 Encounter for screening for lipoid disorders: Secondary | ICD-10-CM | POA: Diagnosis not present

## 2021-11-23 DIAGNOSIS — Z131 Encounter for screening for diabetes mellitus: Secondary | ICD-10-CM | POA: Diagnosis not present

## 2021-11-23 DIAGNOSIS — Z0000 Encounter for general adult medical examination without abnormal findings: Secondary | ICD-10-CM | POA: Diagnosis not present

## 2021-11-23 DIAGNOSIS — Z23 Encounter for immunization: Secondary | ICD-10-CM | POA: Diagnosis not present

## 2022-02-13 DIAGNOSIS — J029 Acute pharyngitis, unspecified: Secondary | ICD-10-CM | POA: Diagnosis not present

## 2022-02-13 DIAGNOSIS — J069 Acute upper respiratory infection, unspecified: Secondary | ICD-10-CM | POA: Diagnosis not present

## 2022-02-13 DIAGNOSIS — Z03818 Encounter for observation for suspected exposure to other biological agents ruled out: Secondary | ICD-10-CM | POA: Diagnosis not present

## 2022-07-08 DIAGNOSIS — Z3689 Encounter for other specified antenatal screening: Secondary | ICD-10-CM | POA: Diagnosis not present

## 2022-07-08 DIAGNOSIS — Z3200 Encounter for pregnancy test, result unknown: Secondary | ICD-10-CM | POA: Diagnosis not present

## 2022-08-26 LAB — OB RESULTS CONSOLE GC/CHLAMYDIA
Chlamydia: NEGATIVE
Neisseria Gonorrhea: NEGATIVE

## 2022-08-26 LAB — OB RESULTS CONSOLE HIV ANTIBODY (ROUTINE TESTING): HIV: NONREACTIVE

## 2022-08-26 LAB — OB RESULTS CONSOLE RPR: RPR: NONREACTIVE

## 2022-08-26 LAB — OB RESULTS CONSOLE HEPATITIS B SURFACE ANTIGEN: Hepatitis B Surface Ag: NEGATIVE

## 2022-08-26 LAB — OB RESULTS CONSOLE RUBELLA ANTIBODY, IGM: Rubella: IMMUNE

## 2023-02-20 LAB — OB RESULTS CONSOLE GBS: GBS: NEGATIVE

## 2023-02-26 NOTE — L&D Delivery Note (Signed)
Delivery Note At 6:03 PM a viable and healthy female was delivered via Vaginal, Spontaneous (Presentation: LOA      ).  APGAR: 9, 9; weight pending .  [place] reduced after somersault maneuver. Placenta status: spontaneous ,intact  .  Cord:  [place] reduced with the following complications: none .  Cord pH: ana  Anesthesia: Epidural Episiotomy: None Lacerations:  none Suture Repair:  na Est. Blood Loss (mL):255    Mom to postpartum.  Baby to Couplet care / Skin to Skin.  April Nicholson J 03/16/2023, 6:13 PM

## 2023-03-07 ENCOUNTER — Other Ambulatory Visit: Payer: Self-pay | Admitting: Obstetrics and Gynecology

## 2023-03-12 ENCOUNTER — Encounter (HOSPITAL_COMMUNITY): Payer: Self-pay | Admitting: *Deleted

## 2023-03-13 ENCOUNTER — Telehealth (HOSPITAL_COMMUNITY): Payer: Self-pay | Admitting: *Deleted

## 2023-03-13 NOTE — Telephone Encounter (Signed)
Preadmission screen  

## 2023-03-16 ENCOUNTER — Inpatient Hospital Stay (HOSPITAL_COMMUNITY)
Admission: RE | Admit: 2023-03-16 | Discharge: 2023-03-18 | DRG: 807 | Disposition: A | Discharge status: Home / Self Care | Payer: 59 | Attending: Obstetrics and Gynecology | Admitting: Obstetrics and Gynecology

## 2023-03-16 ENCOUNTER — Inpatient Hospital Stay (HOSPITAL_COMMUNITY): Payer: 59 | Admitting: Anesthesiology

## 2023-03-16 ENCOUNTER — Encounter (HOSPITAL_COMMUNITY): Payer: Self-pay | Admitting: Obstetrics and Gynecology

## 2023-03-16 ENCOUNTER — Other Ambulatory Visit: Payer: Self-pay

## 2023-03-16 DIAGNOSIS — Z3A39 39 weeks gestation of pregnancy: Secondary | ICD-10-CM | POA: Diagnosis not present

## 2023-03-16 DIAGNOSIS — Z3490 Encounter for supervision of normal pregnancy, unspecified, unspecified trimester: Principal | ICD-10-CM | POA: Diagnosis present

## 2023-03-16 DIAGNOSIS — Z8249 Family history of ischemic heart disease and other diseases of the circulatory system: Secondary | ICD-10-CM

## 2023-03-16 DIAGNOSIS — O26893 Other specified pregnancy related conditions, third trimester: Secondary | ICD-10-CM | POA: Diagnosis present

## 2023-03-16 LAB — CBC
HCT: 37.2 % (ref 36.0–46.0)
Hemoglobin: 13.1 g/dL (ref 12.0–15.0)
MCH: 31.2 pg (ref 26.0–34.0)
MCHC: 35.2 g/dL (ref 30.0–36.0)
MCV: 88.6 fL (ref 80.0–100.0)
Platelets: 256 10*3/uL (ref 150–400)
RBC: 4.2 MIL/uL (ref 3.87–5.11)
RDW: 13 % (ref 11.5–15.5)
WBC: 7.9 10*3/uL (ref 4.0–10.5)
nRBC: 0 % (ref 0.0–0.2)

## 2023-03-16 LAB — TYPE AND SCREEN
ABO/RH(D): O POS
Antibody Screen: NEGATIVE

## 2023-03-16 LAB — RPR: RPR Ser Ql: NONREACTIVE

## 2023-03-16 MED ORDER — EPHEDRINE 5 MG/ML INJ: 10.0000 mg | INTRAVENOUS | Status: DC | PRN

## 2023-03-16 MED ORDER — WITCH HAZEL-GLYCERIN EX PADS: 1.0000 | MEDICATED_PAD | CUTANEOUS | Status: DC | PRN

## 2023-03-16 MED ORDER — LACTATED RINGERS IV SOLN: 500.0000 mL | INTRAVENOUS | Status: DC | PRN

## 2023-03-16 MED ORDER — BENZOCAINE-MENTHOL 20-0.5 % EX AERO: 1.0000 | INHALATION_SPRAY | CUTANEOUS | Status: DC | PRN

## 2023-03-16 MED ORDER — PRENATAL MULTIVITAMIN CH
1.0000 | ORAL_TABLET | Freq: Every day | ORAL | Status: DC
  Administered 2023-03-17 – 2023-03-18 (×2): 1 via ORAL
  Filled 2023-03-16 (×2): qty 1

## 2023-03-16 MED ORDER — SENNOSIDES-DOCUSATE SODIUM 8.6-50 MG PO TABS
2.0000 | ORAL_TABLET | Freq: Every day | ORAL | Status: DC
  Administered 2023-03-17 – 2023-03-18 (×2): 2 via ORAL
  Filled 2023-03-16 (×2): qty 2

## 2023-03-16 MED ORDER — IBUPROFEN 600 MG PO TABS
600.0000 mg | ORAL_TABLET | Freq: Four times a day (QID) | ORAL | Status: DC
  Administered 2023-03-16 – 2023-03-18 (×7): 600 mg via ORAL
  Filled 2023-03-16 (×7): qty 1

## 2023-03-16 MED ORDER — DIBUCAINE (PERIANAL) 1 % EX OINT: 1.0000 | TOPICAL_OINTMENT | CUTANEOUS | Status: DC | PRN

## 2023-03-16 MED ORDER — SOD CITRATE-CITRIC ACID 500-334 MG/5ML PO SOLN: 30.0000 mL | ORAL | Status: DC | PRN

## 2023-03-16 MED ORDER — SIMETHICONE 80 MG PO CHEW: 80.0000 mg | CHEWABLE_TABLET | ORAL | Status: DC | PRN

## 2023-03-16 MED ORDER — DIPHENHYDRAMINE HCL 50 MG/ML IJ SOLN: 12.5000 mg | INTRAMUSCULAR | Status: DC | PRN

## 2023-03-16 MED ORDER — METHYLERGONOVINE MALEATE 0.2 MG PO TABS: 0.2000 mg | ORAL_TABLET | ORAL | Status: DC | PRN

## 2023-03-16 MED ORDER — METHYLERGONOVINE MALEATE 0.2 MG/ML IJ SOLN: 0.2000 mg | INTRAMUSCULAR | Status: DC | PRN

## 2023-03-16 MED ORDER — DIPHENHYDRAMINE HCL 25 MG PO CAPS: 25.0000 mg | ORAL_CAPSULE | Freq: Four times a day (QID) | ORAL | Status: DC | PRN

## 2023-03-16 MED ORDER — PHENYLEPHRINE 80 MCG/ML (10ML) SYRINGE FOR IV PUSH (FOR BLOOD PRESSURE SUPPORT): 80.0000 ug | PREFILLED_SYRINGE | INTRAVENOUS | Status: DC | PRN

## 2023-03-16 MED ORDER — LACTATED RINGERS IV SOLN: INTRAVENOUS | Status: DC

## 2023-03-16 MED ORDER — FENTANYL-BUPIVACAINE-NACL 0.5-0.125-0.9 MG/250ML-% EP SOLN
12.0000 mL/h | EPIDURAL | Status: DC | PRN
  Administered 2023-03-16: 12 mL/h via EPIDURAL
  Filled 2023-03-16: qty 250

## 2023-03-16 MED ORDER — OXYTOCIN-SODIUM CHLORIDE 30-0.9 UT/500ML-% IV SOLN
1.0000 m[IU]/min | INTRAVENOUS | Status: DC
  Administered 2023-03-16: 2 m[IU]/min via INTRAVENOUS
  Filled 2023-03-16: qty 500

## 2023-03-16 MED ORDER — ACETAMINOPHEN 325 MG PO TABS
650.0000 mg | ORAL_TABLET | ORAL | Status: DC | PRN
  Administered 2023-03-17 – 2023-03-18 (×5): 650 mg via ORAL
  Filled 2023-03-16 (×5): qty 2

## 2023-03-16 MED ORDER — OXYTOCIN BOLUS FROM INFUSION
333.0000 mL | Freq: Once | INTRAVENOUS | Status: AC
  Administered 2023-03-16: 333 mL via INTRAVENOUS

## 2023-03-16 MED ORDER — LIDOCAINE HCL (PF) 1 % IJ SOLN: 30.0000 mL | INTRAMUSCULAR | Status: DC | PRN

## 2023-03-16 MED ORDER — ONDANSETRON HCL 4 MG PO TABS: 4.0000 mg | ORAL_TABLET | ORAL | Status: DC | PRN

## 2023-03-16 MED ORDER — ONDANSETRON HCL 4 MG/2ML IJ SOLN: 4.0000 mg | Freq: Four times a day (QID) | INTRAMUSCULAR | Status: DC | PRN

## 2023-03-16 MED ORDER — TETANUS-DIPHTH-ACELL PERTUSSIS 5-2.5-18.5 LF-MCG/0.5 IM SUSY: 0.5000 mL | PREFILLED_SYRINGE | Freq: Once | INTRAMUSCULAR | Status: DC

## 2023-03-16 MED ORDER — OXYTOCIN-SODIUM CHLORIDE 30-0.9 UT/500ML-% IV SOLN
2.5000 [IU]/h | INTRAVENOUS | Status: DC
  Administered 2023-03-16: 2.5 [IU]/h via INTRAVENOUS

## 2023-03-16 MED ORDER — MISOPROSTOL 50MCG HALF TABLET
50.0000 ug | ORAL_TABLET | Freq: Once | ORAL | Status: AC
  Administered 2023-03-16: 50 ug via VAGINAL
  Filled 2023-03-16: qty 1

## 2023-03-16 MED ORDER — TERBUTALINE SULFATE 1 MG/ML IJ SOLN: 0.2500 mg | Freq: Once | INTRAMUSCULAR | Status: DC | PRN

## 2023-03-16 MED ORDER — LIDOCAINE HCL (PF) 1 % IJ SOLN
INTRAMUSCULAR | Status: DC | PRN
  Administered 2023-03-16 (×2): 4 mL via EPIDURAL

## 2023-03-16 MED ORDER — AMMONIA AROMATIC IN INHA
RESPIRATORY_TRACT | Status: AC
  Filled 2023-03-16: qty 10

## 2023-03-16 MED ORDER — LACTATED RINGERS IV SOLN
500.0000 mL | Freq: Once | INTRAVENOUS | Status: DC
Start: 2023-03-16 — End: 2023-03-16

## 2023-03-16 MED ORDER — ACETAMINOPHEN 325 MG PO TABS: 650.0000 mg | ORAL_TABLET | ORAL | Status: DC | PRN

## 2023-03-16 MED ORDER — ONDANSETRON HCL 4 MG/2ML IJ SOLN: 4.0000 mg | INTRAMUSCULAR | Status: DC | PRN

## 2023-03-16 MED ORDER — ZOLPIDEM TARTRATE 5 MG PO TABS: 5.0000 mg | ORAL_TABLET | Freq: Every evening | ORAL | Status: DC | PRN

## 2023-03-16 MED ORDER — COCONUT OIL OIL: 1.0000 | TOPICAL_OIL | Status: DC | PRN

## 2023-03-16 NOTE — H&P (Signed)
Arsie States is a 28 y.o. female presenting for IOL new onset DFM over past week. Reassuring fetal surveillance.. OB History     Gravida  3   Para  1   Term  1   Preterm  0   AB  1   Living  1      SAB  1   IAB  0   Ectopic  0   Multiple  0   Live Births  1          No past medical history on file. Past Surgical History:  Procedure Laterality Date   NO PAST SURGERIES     Family History: family history includes Asthma in her maternal grandmother; Healthy in her father and mother; Hypertension in her maternal grandmother. Social History:  reports that she has never smoked. She has never used smokeless tobacco. She reports that she does not drink alcohol and does not use drugs.     Maternal Diabetes: No Genetic Screening: Normal Maternal Ultrasounds/Referrals: Normal Fetal Ultrasounds or other Referrals:  None Maternal Substance Abuse:  No Significant Maternal Medications:  None Significant Maternal Lab Results:  Group B Strep negative Number of Prenatal Visits:greater than 3 verified prenatal visits Maternal Vaccinations:TDap Other Comments:  None  Review of Systems  Constitutional: Negative.   All other systems reviewed and are negative.  Maternal Medical History:  Reason for admission: Contractions.   Contractions: Onset was more than 2 days ago.   Frequency: rare.   Perceived severity is mild.   Fetal activity: Perceived fetal activity is normal.   Last perceived fetal movement was within the past hour.   Prenatal complications: no prenatal complications Prenatal Complications - Diabetes: none.     unknown if currently breastfeeding. Maternal Exam:  Uterine Assessment: Contraction strength is mild.  Contraction frequency is irregular.  Abdomen: Patient reports no abdominal tenderness. Fetal presentation: vertex Introitus: Normal vulva. Normal vagina.  Ferning test: not done.  Nitrazine test: not done. Amniotic fluid character: not  assessed. Pelvis: adequate for delivery.   Cervix: Cervix evaluated by digital exam.     Physical Exam Vitals and nursing note reviewed.  Constitutional:      Appearance: Normal appearance. She is normal weight.  HENT:     Head: Normocephalic and atraumatic.  Cardiovascular:     Rate and Rhythm: Normal rate and regular rhythm.     Pulses: Normal pulses.     Heart sounds: Normal heart sounds.  Pulmonary:     Effort: Pulmonary effort is normal.     Breath sounds: Normal breath sounds.  Abdominal:     General: Bowel sounds are normal.     Palpations: Abdomen is soft.  Genitourinary:    General: Normal vulva.  Musculoskeletal:     Cervical back: Normal range of motion and neck supple.  Skin:    General: Skin is warm and dry.  Neurological:     General: No focal deficit present.     Mental Status: She is alert and oriented to person, place, and time.  Psychiatric:        Mood and Affect: Mood normal.        Behavior: Behavior normal.     Prenatal labs: ABO, Rh:   Antibody:   Rubella: Immune (07/01 0000) RPR: Nonreactive (07/01 0000)  HBsAg: Negative (07/01 0000)  HIV: Non-reactive (07/01 0000)  GBS: Negative/-- (12/26 0000)   Assessment/Plan: 39+ wk IUP DFM IOL   Isack Lavalley J 03/16/2023, 9:14 AM

## 2023-03-16 NOTE — Anesthesia Procedure Notes (Signed)
Epidural Patient location during procedure: OB Start time: 03/16/2023 2:40 PM End time: 03/16/2023 2:45 PM  Staffing Anesthesiologist: Linton Rump, MD Performed: anesthesiologist   Preanesthetic Checklist Completed: patient identified, IV checked, site marked, risks and benefits discussed, surgical consent, monitors and equipment checked, pre-op evaluation and timeout performed  Epidural Patient position: sitting Prep: DuraPrep and site prepped and draped Patient monitoring: continuous pulse ox and blood pressure Approach: midline Location: L3-L4 Injection technique: LOR saline  Needle:  Needle type: Tuohy  Needle gauge: 17 G Needle length: 9 cm and 9 Needle insertion depth: 4 cm Catheter type: closed end flexible Catheter size: 19 Gauge Catheter at skin depth: 8 cm Test dose: negative  Assessment Events: blood not aspirated, no cerebrospinal fluid, injection not painful, no injection resistance, no paresthesia and negative IV test  Additional Notes The patient has requested an epidural for labor pain management. Risks and benefits including, but not limited to, infection, bleeding, local anesthetic toxicity, headache, hypotension, back pain, block failure, etc. were discussed with the patient. The patient expressed understanding and consented to the procedure. I confirmed that the patient has no bleeding disorders and is not taking blood thinners. I confirmed the patient's last platelet count with the nurse. A time-out was performed immediately prior to the procedure. Please see nursing documentation for vital signs. Sterile technique was used throughout the whole procedure. Once LOR achieved, the epidural catheter threaded easily without resistance. Aspiration of the catheter was negative for blood and CSF. The epidural was dosed slowly and an infusion was started.  1 attempt(s)Reason for block:procedure for pain

## 2023-03-16 NOTE — Anesthesia Preprocedure Evaluation (Signed)
Anesthesia Evaluation  Patient identified by MRN, date of birth, ID band Patient awake    Reviewed: Allergy & Precautions, NPO status , Patient's Chart, lab work & pertinent test results  History of Anesthesia Complications Negative for: history of anesthetic complications  Airway Mallampati: III  TM Distance: >3 FB Neck ROM: Full    Dental   Pulmonary neg pulmonary ROS   Pulmonary exam normal breath sounds clear to auscultation       Cardiovascular negative cardio ROS  Rhythm:Regular Rate:Normal     Neuro/Psych negative neurological ROS     GI/Hepatic negative GI ROS, Neg liver ROS,,,  Endo/Other  negative endocrine ROS    Renal/GU negative Renal ROS     Musculoskeletal   Abdominal   Peds  Hematology negative hematology ROS (+) Lab Results      Component                Value               Date                      WBC                      7.9                 03/16/2023                HGB                      13.1                03/16/2023                HCT                      37.2                03/16/2023                MCV                      88.6                03/16/2023                PLT                      256                 03/16/2023              Anesthesia Other Findings   Reproductive/Obstetrics (+) Pregnancy                              Anesthesia Physical Anesthesia Plan  ASA: 2  Anesthesia Plan: Epidural   Post-op Pain Management:    Induction:   PONV Risk Score and Plan:   Airway Management Planned: Natural Airway  Additional Equipment:   Intra-op Plan:   Post-operative Plan:   Informed Consent: I have reviewed the patients History and Physical, chart, labs and discussed the procedure including the risks, benefits and alternatives for the proposed anesthesia with the patient or authorized representative who has indicated his/her understanding and  acceptance.       Plan Discussed with:  Anesthesiologist  Anesthesia Plan Comments: (I have discussed risks of neuraxial anesthesia including but not limited to infection, bleeding, nerve injury, back pain, headache, seizures, and failure of block. Patient denies bleeding disorders and is not currently anticoagulated. Labs have been reviewed. Risks and benefits discussed. All patient's questions answered.  )         Anesthesia Quick Evaluation

## 2023-03-16 NOTE — Lactation Note (Signed)
This note was copied from a baby's chart. Lactation Consultation Note  Patient Name: April Nicholson [id number] Date: 03/16/2023 Age:30 hours Reason for consult: Initial assessment;Term  P2- MOB had severe latching issues with first child, but stated that she was still able to breastfeed her older child for 11 months. LC praised MOB. MOB reports that this infant has been nursing great as soon as she was delivered. MOB currently had infant latched to the right breast in the cross cradle hold. Infant seemed to have a strong rhythmic suck and did not need external stimulation to continue sucking. LC also heard multiple audible swallows from infant. LC praised infant and MOB. MOB reports that it does not hurt/pinch when infant is nursing, but her nipples are starting to become sore with how frequent infant is nursing. LC provided MOB with coconut oil and hydrogel pads to help. MOB also requested a manual pump. This too was provided to MOB. MOB denies having any questions or concerns at this time.  LC reviewed feeding infant on cue 8-12x in 24 hrs, not allowing infant to go over 3 hrs without a feeding, CDC milk storage guidelines and LC services handout. LC encouraged MOB to call for further assistance as needed.  Maternal Data Has patient been taught Hand Expression?: No Does the patient have breastfeeding experience prior to this delivery?: Yes How long did the patient breastfeed?: 11 months  Feeding Mother's Current Feeding Choice: Breast Milk  LATCH Score Latch: Grasps breast easily, tongue down, lips flanged, rhythmical sucking.  Audible Swallowing: Spontaneous and intermittent  Type of Nipple: Everted at rest and after stimulation  Comfort (Breast/Nipple): Filling, red/small blisters or bruises, mild/mod discomfort  Hold (Positioning): No assistance needed to correctly position infant at breast.  LATCH Score: 9   Lactation Tools Discussed/Used Tools: Pump;Flanges;Coconut  oil;Comfort gels Flange Size: 21 (could benefit from size 15 mm flanges when available) Breast pump type: Manual Pump Education: Setup, frequency, and cleaning;Milk Storage Reason for Pumping: MOB request Pumping frequency: 15-20 min every 3 hrs as needed  Interventions Interventions: Breast feeding basics reviewed;Coconut oil;Comfort gels;Hand pump;Education;LC Services brochure  Discharge Discharge Education: Engorgement and breast care;Warning signs for feeding baby Pump: DEBP;Manual;Personal  Consult Status Consult Status: Follow-up Date: 03/17/23 Follow-up type: In-patient    Dema Severin BS, IBCLC 03/16/2023, 11:15 PM

## 2023-03-17 LAB — CBC
HCT: 33.6 % — ABNORMAL LOW (ref 36.0–46.0)
Hemoglobin: 11.8 g/dL — ABNORMAL LOW (ref 12.0–15.0)
MCH: 32 pg (ref 26.0–34.0)
MCHC: 35.1 g/dL (ref 30.0–36.0)
MCV: 91.1 fL (ref 80.0–100.0)
Platelets: 202 10*3/uL (ref 150–400)
RBC: 3.69 MIL/uL — ABNORMAL LOW (ref 3.87–5.11)
RDW: 13.2 % (ref 11.5–15.5)
WBC: 11.1 10*3/uL — ABNORMAL HIGH (ref 4.0–10.5)
nRBC: 0 % (ref 0.0–0.2)

## 2023-03-17 LAB — BIRTH TISSUE RECOVERY COLLECTION (PLACENTA DONATION)

## 2023-03-17 NOTE — Lactation Note (Signed)
This note was copied from a baby's chart. Lactation Consultation Note  Patient Name: April Nicholson [id number] Date: 03/17/2023 Age:30 hours Reason for consult: Follow-up assessment;Term  P2 mom of 20 hour old infant consulted for follow up. Mom reports infant is breastfeeding for about 30 minutes at a time and then feeds about 4 hours later with difficulties waking infant up to feed at the 3 hour. Reports tenderness and soreness of both nipples and noticing nipple compressions after the latch is removed. LC asked if able to perform an oral assessment while infant was asleep. Mom agreed. Infant has a tight labial frenulum with perioral gum blanching and a tight lingual frenulum - although lateralization, extension and cupping is optimal. Infant during gloved finger suck test has high palatal arch. Discussed suck training exercises to help with oral function and Lc left to provide resource hand out.   LC left to get the hand out and returned to room. Mom noticed infant was showing cues so asked LC to stay and observe the latch. Mom independently placed infant to the left breast in cross cradle hold (reclined). Infant nuzzled at the breast and was able to successful latch deeply. Minimal discomfort noted from mom. LC observed RSS and optimal positioning of infant. Provided education and reassurance. Discussed to continue to use the great latch-on techniques that mom is doing and to call for any assistance as needed.   LC left parents to continue breastfeeding session.  Maternal Data    Feeding Mother's Current Feeding Choice: Breast Milk  LATCH Score Latch: Grasps breast easily, tongue down, lips flanged, rhythmical sucking.  Audible Swallowing: Spontaneous and intermittent  Type of Nipple: Everted at rest and after stimulation  Comfort (Breast/Nipple): Filling, red/small blisters or bruises, mild/mod discomfort  Hold (Positioning): Assistance needed to correctly position infant at breast  and maintain latch.  LATCH Score: 8   Lactation Tools Discussed/Used    Interventions Interventions: Breast feeding basics reviewed;Assisted with latch;Skin to skin;Education;Position options;Support pillows;Adjust position  Discharge    Consult Status Consult Status: Follow-up Date: 03/18/23 Follow-up type: In-patient    Su Grand 03/17/2023, 2:21 PM

## 2023-03-17 NOTE — Anesthesia Postprocedure Evaluation (Signed)
Anesthesia Post Note  Patient: April Nicholson  Procedure(s) Performed: AN AD HOC LABOR EPIDURAL     Patient location during evaluation: Mother Baby Anesthesia Type: Epidural Level of consciousness: awake and alert Pain management: pain level controlled Vital Signs Assessment: post-procedure vital signs reviewed and stable Respiratory status: spontaneous breathing, nonlabored ventilation and respiratory function stable Cardiovascular status: stable Postop Assessment: no headache, no backache and epidural receding Anesthetic complications: no   No notable events documented.  Last Vitals:  Vitals:   03/17/23 0100 03/17/23 0435  BP: 97/68 96/65  Pulse: 86 65  Resp: 18 18  Temp: 36.8 C 36.8 C  SpO2: 100% 99%    Last Pain:  Vitals:   03/17/23 0633  TempSrc:   PainSc: 4    Pain Goal:                   Salome Arnt

## 2023-03-17 NOTE — Progress Notes (Signed)
No c/o; pain controlled, lochia wnl Voids w/o difficulty Breastfeeding, girl  Patient Vitals for the past 24 hrs:  BP Temp Temp src Pulse Resp SpO2  03/17/23 0835 102/71 98.2 F (36.8 C) Oral 75 18 98 %  03/17/23 0435 96/65 98.2 F (36.8 C) Oral 65 18 99 %  03/17/23 0100 97/68 98.2 F (36.8 C) Oral 86 18 100 %  03/16/23 2110 109/75 98 F (36.7 C) Oral 96 20 100 %  03/16/23 2012 106/71 98.4 F (36.9 C) Oral 81 18 98 %  03/16/23 1915 102/67 -- -- 80 -- --  03/16/23 1900 103/68 -- -- 76 -- --  03/16/23 1808 119/66 -- -- 90 -- --  03/16/23 1732 105/72 -- -- 71 -- --  03/16/23 1701 110/64 -- -- 61 -- --  03/16/23 1631 (!) 96/59 -- -- 74 16 --  03/16/23 1601 (!) 97/55 -- -- 97 -- --  03/16/23 1531 (!) 96/57 -- -- 69 -- --  03/16/23 1519 (!) 88/51 -- -- 66 -- --  03/16/23 1518 (!) 88/49 -- -- 68 -- --  03/16/23 1514 -- 98.3 F (36.8 C) Oral -- -- --  03/16/23 1510 (!) 102/55 -- -- 64 16 --  03/16/23 1452 (!) 106/49 -- -- 77 -- --  03/16/23 1451 -- -- -- -- -- 100 %  03/16/23 1450 111/62 -- -- 71 -- --  03/16/23 1448 (!) 137/117 -- -- (!) 135 -- --  03/16/23 1446 107/74 -- -- 83 -- 100 %  03/16/23 1445 107/74 -- -- 83 -- --  03/16/23 1356 (!) 93/44 -- -- 74 -- --  03/16/23 1221 110/68 -- -- 79 -- --   A&Ox3 Nml respirations Abd: soft,nt,nd; fundus firm and below umb LE: no edema, nt bilat     Latest Ref Rng & Units 03/17/2023    4:31 AM 03/16/2023    9:52 AM 04/28/2021    4:42 AM  CBC  WBC 4.0 - 10.5 K/uL 11.1  7.9  9.9   Hemoglobin 12.0 - 15.0 g/dL 01.0  27.2  53.6   Hematocrit 36.0 - 46.0 % 33.6  37.2  30.7   Platelets 150 - 400 K/uL 202  256  164    A/P: ppd1 s/p svd Doing well, d/c home tomorrow Girl - breastfeeding RH pos RI

## 2023-03-18 MED ORDER — SENNOSIDES-DOCUSATE SODIUM 8.6-50 MG PO TABS: 2.0000 | ORAL_TABLET | Freq: Every day | ORAL | Status: AC

## 2023-03-18 NOTE — Lactation Note (Signed)
This note was copied from a baby's chart. Lactation Consultation Note  Patient Name: April Nicholson Date: 03/18/2023 Age:30 hours Reason for consult: Follow-up assessment;Infant weight loss;Term;Nipple pain/trauma (6 % weight loss,) OA incompatibility with + DAT. Bilirubin skin check - 7.2 @ 36 hours  LC reviewed the doc flow sheets and updated a recent feeding.  Mom holding baby and she is sound asleep.  LC reviewed engorgement prevention and tx , sore nipple prevention and tx. LC provided shells. See below for details.   Maternal Data Has patient been taught Hand Expression?:  (per mom aware)  Feeding Mother's Current Feeding Choice: Breast Milk  LATCH Score - LC unable to assess a latch at this consult, per mom the baby recently fed. Per  mom the baby is latching much better and easier.     Lactation Tools Discussed/Used Tools: Shells;Pump;Other (comment) (this LC provided shells as a preventive measure and per mom had already been given a hand pump and flange check) Flange Size: 21 Breast pump type: Manual Pump Education: Milk Storage;Setup, frequency, and cleaning Reason for Pumping: per mom the right nipple soreness is resolving. LC recommended steps for latching - 1st breast - breast massage, hand express, pre-pump if needed and reverse pressure to elongate the nipple / areola complex.  Interventions Interventions: Breast feeding basics reviewed;Reverse pressure;Shells;Hand pump;Coconut oil;Comfort gels;Education;LC Services brochure;CDC Guidelines for Breast Pump Cleaning  Discharge Discharge Education: Engorgement and breast care;Warning signs for feeding baby Pump: DEBP;Manual;Personal WIC Program: No  Consult Status Consult Status: Complete Date: 03/18/23    Kathrin Greathouse 03/18/2023, 8:40 AM

## 2023-03-18 NOTE — Discharge Summary (Signed)
Postpartum Discharge Summary  Date of Service updated 03/18/23     Patient Name: April Nicholson DOB: 15-Aug-1995 MRN: [id number]  Date of admission: 03/16/2023 Delivery date:03/16/2023 Delivering provider: Olivia Mackie Date of discharge: 03/18/2023  Admitting diagnosis: Encounter for induction of labor [Z34.90] Intrauterine pregnancy: [redacted]w[redacted]d     Secondary diagnosis:  Principal Problem:   Postpartum care following vaginal delivery 1/19 Active Problems:   Encounter for induction of labor  Additional problems: none    Discharge diagnosis: Term Pregnancy Delivered                                              Post partum procedures: n/a Augmentation: AROM and Pitocin Complications: None  Hospital course: Induction of Labor With Vaginal Delivery   28 y.o. yo [id number] at [redacted]w[redacted]d was admitted to the hospital 03/16/2023 for induction of labor.  Indication for induction: Elective.  Patient had an labor course complicated by none Membrane Rupture Time/Date: 12:18 PM,03/16/2023  Delivery Method:Vaginal, Spontaneous Operative Delivery:N/A Episiotomy: None Lacerations:  None Details of delivery can be found in separate delivery note.  Patient had a postpartum course complicated by none. Patient is discharged home 03/18/23.  Newborn Data: Birth date:03/16/2023 Birth time:6:03 PM Gender:Female Living status:Living Apgars:9 ,9  Weight:3540 g  Magnesium Sulfate received: No BMZ received: No Rhophylac:N/A MMR:N/A  Immunizations administered: There is no immunization history for the selected administration types on file for this patient.  Physical exam  Vitals:   03/17/23 0835 03/17/23 1213 03/17/23 2052 03/18/23 0500  BP: 102/71 102/60 114/85 117/79  Pulse: 75 63 71 77  Resp: 18 18 18 18   Temp: 98.2 F (36.8 C) 98.4 F (36.9 C) 98.2 F (36.8 C) 98 F (36.7 C)  TempSrc: Oral Oral Oral   SpO2: 98%  98% 100%  Weight:      Height:       General: alert and no distress Lochia:  appropriate Uterine Fundus: firm Incision: N/A DVT Evaluation: No evidence of DVT seen on physical exam. Labs: Lab Results  Component Value Date   WBC 11.1 (H) 03/17/2023   HGB 11.8 (L) 03/17/2023   HCT 33.6 (L) 03/17/2023   MCV 91.1 03/17/2023   PLT 202 03/17/2023      Latest Ref Rng & Units 03/02/2021    9:02 AM  CMP  Glucose 70 - 99 mg/dL 93   BUN 6 - 20 mg/dL <5   Creatinine 1.47 - 1.00 mg/dL 8.29   Sodium 562 - 130 mmol/L 133   Potassium 3.5 - 5.1 mmol/L 4.0   Chloride 98 - 111 mmol/L 104   CO2 22 - 32 mmol/L 21   Calcium 8.9 - 10.3 mg/dL 9.0   Total Protein 6.5 - 8.1 g/dL 6.1   Total Bilirubin 0.3 - 1.2 mg/dL 0.4   Alkaline Phos 38 - 126 U/L 69   AST 15 - 41 U/L 29   ALT 0 - 44 U/L 34    Edinburgh Score:    03/17/2023    8:24 PM  Edinburgh Postnatal Depression Scale Screening Tool  I have been able to laugh and see the funny side of things. 0  I have looked forward with enjoyment to things. 0  I have blamed myself unnecessarily when things went wrong. 0  I have been anxious or worried for no good reason. 0  I have  felt scared or panicky for no good reason. 0  Things have been getting on top of me. 0  I have been so unhappy that I have had difficulty sleeping. 0  I have felt sad or miserable. 0  I have been so unhappy that I have been crying. 1  The thought of harming myself has occurred to me. 0  Edinburgh Postnatal Depression Scale Total 1      After visit meds:  Allergies as of 03/18/2023       Reactions   Compazine [prochlorperazine Edisylate] Other (See Comments)   seizure        Medication List     TAKE these medications    ibuprofen 600 MG tablet Commonly known as: ADVIL Take 1 tablet (600 mg total) by mouth every 6 (six) hours.   prenatal multivitamin Tabs tablet Take 1 tablet by mouth daily at 12 noon.   senna-docusate 8.6-50 MG tablet Commonly known as: Senokot-S Take 2 tablets by mouth daily.               Discharge Care  Instructions  (From admission, onward)           Start     Ordered   03/18/23 0000  Discharge wound care:       Comments: Sitz baths 2 times /day with warm water x 1 week. May add herbals: 1 ounce dried comfrey leaf* 1 ounce calendula flowers 1 ounce lavender flowers  Supplies can be found online at [employer] sources at [employer], [employer]  1/2 ounce dried uva ursi leaves 1/2 ounce witch hazel blossoms (if you can find them) 1/2 ounce dried sage leaf 1/2 cup sea salt Directions: Bring 2 quarts of water to a boil. Turn off heat, and place 1 ounce (approximately 1 large handful) of the above mixed herbs (not the salt) into the pot. Steep, covered, for 30 minutes.  Strain the liquid well with a fine mesh strainer, and discard the herb material. Add 2 quarts of liquid to the tub, along with the 1/2 cup of salt. This medicinal liquid can also be made into compresses and peri-rinses.   03/18/23 1041             Discharge home in stable condition Infant Feeding: Breast Infant Disposition:home with mother Discharge instruction: per After Visit Summary and Postpartum booklet. Activity: Advance as tolerated. Pelvic rest for 6 weeks.  Diet: routine diet Anticipated Birth Control: Unsure Postpartum Appointment:6 weeks Additional Postpartum F/U:  n/a Future Appointments:No future appointments. Follow up Visit: Patient to follow up with Dr. Billy Coast at [facility] in 6 weeks for postpartum visit or sooner as needed     03/18/2023 Garrick Midgley A Zoella Roberti, DO

## 2023-03-26 ENCOUNTER — Inpatient Hospital Stay (HOSPITAL_COMMUNITY): Payer: 59

## 2023-03-27 ENCOUNTER — Telehealth (HOSPITAL_COMMUNITY): Payer: Self-pay | Admitting: *Deleted

## 2023-03-27 NOTE — Telephone Encounter (Signed)
03/27/2023  Name: April Nicholson MRN: [id number] DOB: [birth year]  Reason for Call:  Transition of Care Hospital Discharge Call  Contact Status: Patient Contact Status: Message  Language assistant needed:          Follow-Up Questions:    Inocente Salles Postnatal Depression Scale:  In the Past 7 Days:    PHQ2-9 Depression Scale:     Discharge Follow-up:    Post-discharge interventions: NA  Maudry Diego, RN 03/27/2023 1711

## 2023-12-14 IMAGING — DX DG CHEST 1V PORT
1 series · 1 of 1 positions shown · non-contrast
Comparison: None.

CLINICAL DATA: Shortness of breath.

EXAM:
PORTABLE CHEST 1 VIEW

[chest]
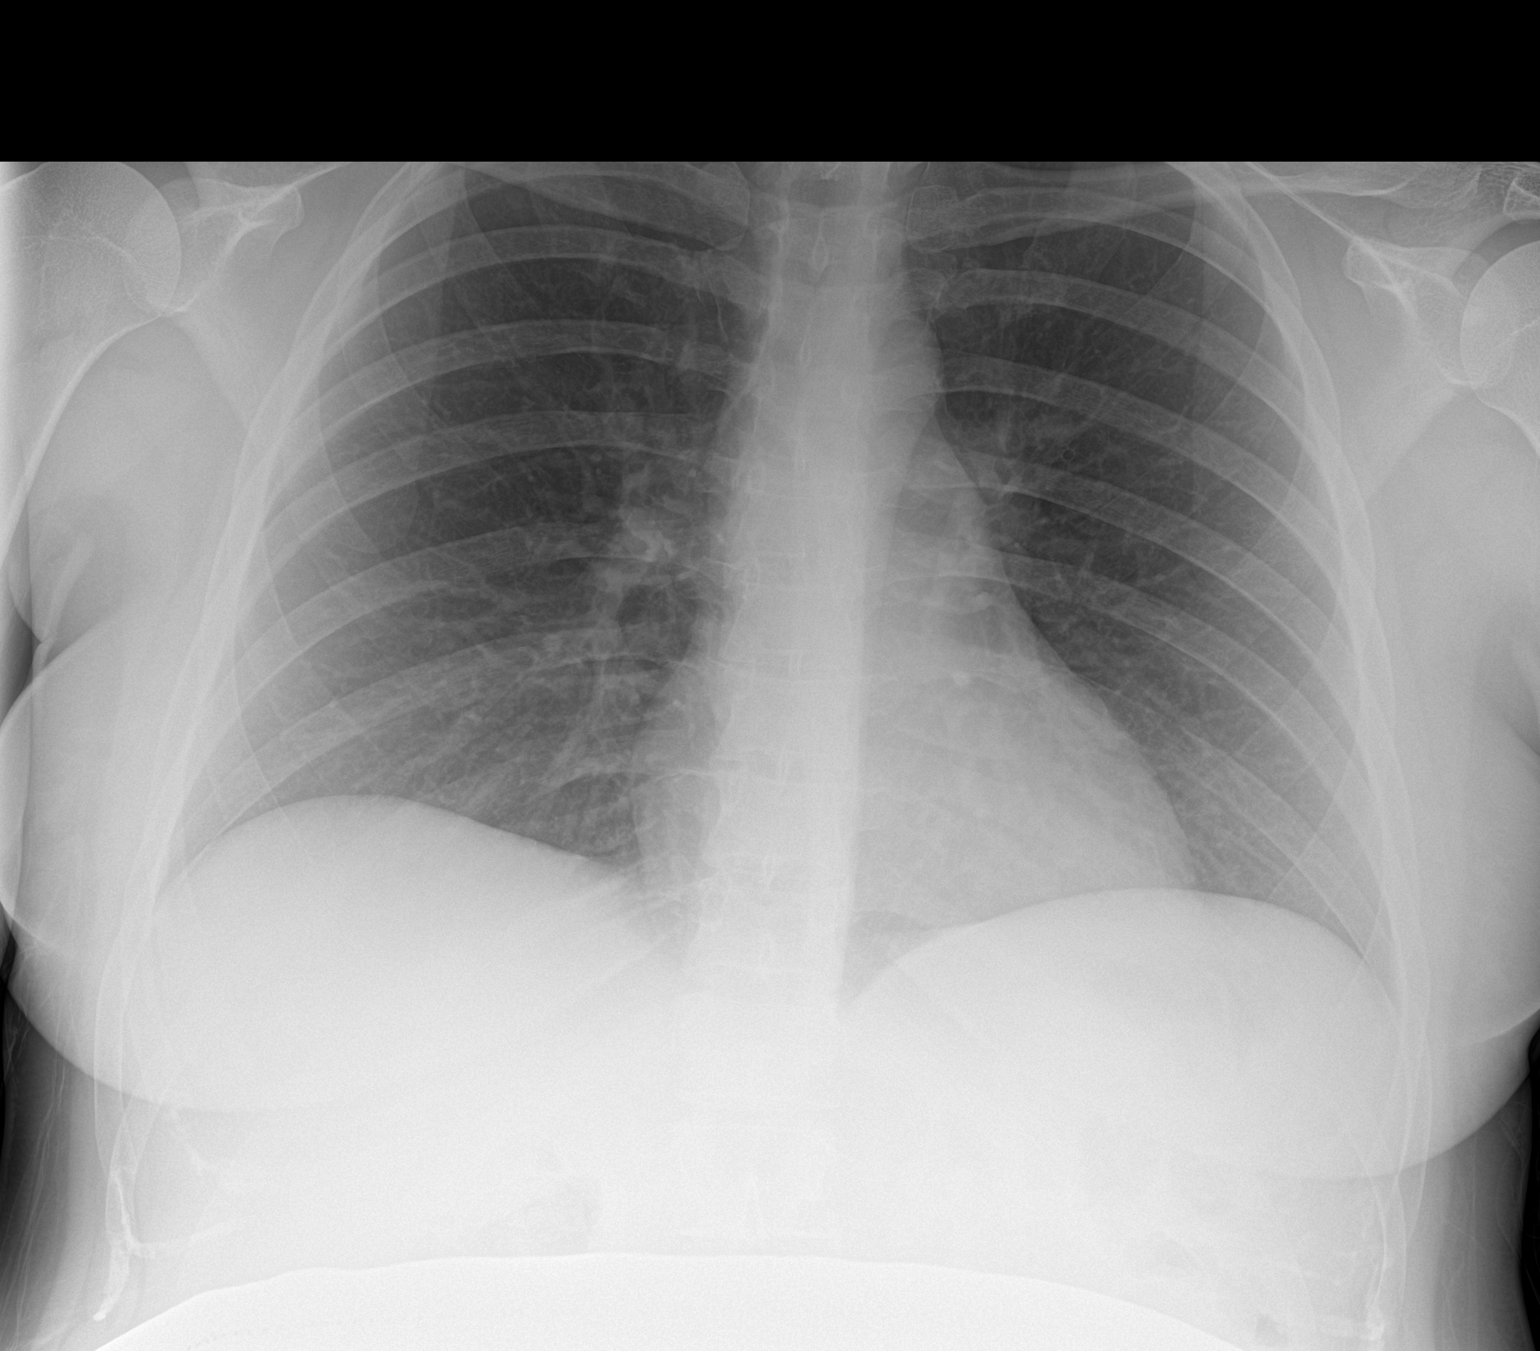

[1 of 1 positions shown; findings below may reference images not displayed]

FINDINGS: The heart size and mediastinal contours are within normal limits.
Both lungs are clear. The visualized skeletal structures are
unremarkable.
IMPRESSION: No active disease.

## 2023-12-14 IMAGING — CT CT ANGIO CHEST
2 of 6 series · 19 of 36 positions shown · IV contrast (omnipaque)
Comparison: None.

CLINICAL DATA: Shortness of breath.  Pregnancy.

EXAM:
CT ANGIOGRAPHY CHEST WITH CONTRAST
TECHNIQUE: Multidetector CT imaging of the chest was performed using the
standard protocol during bolus administration of intravenous
contrast. Multiplanar CT image reconstructions and MIPs were
obtained to evaluate the vascular anatomy.
CONTRAST:  80mL OMNIPAQUE IOHEXOL 350 MG/ML SOLN

[Series 7: pe thins · axial · 0.83mm/px · z∈[+1192,+1406]mm · 18 of 339 slices shown]
[im 17/339  lung]
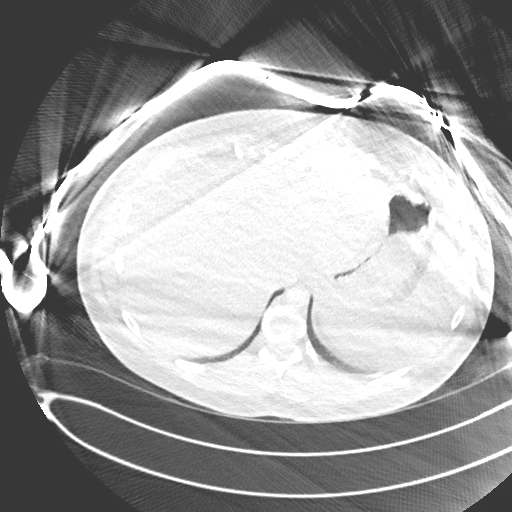
[im 34/339  mediastinal]
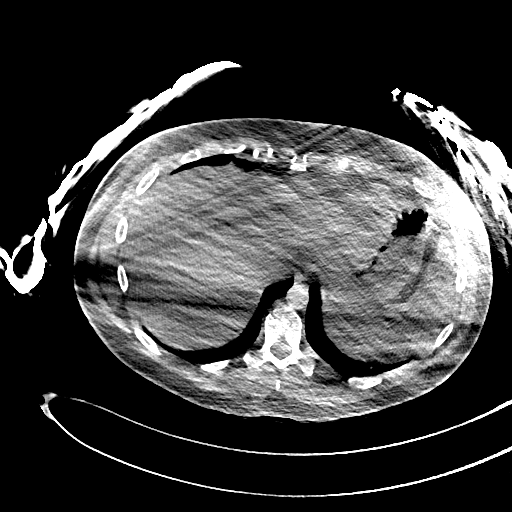
[im 51/339  lung]
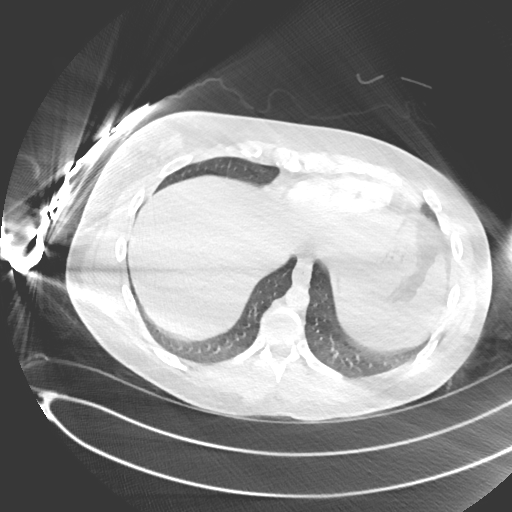
[im 68/339  mediastinal]
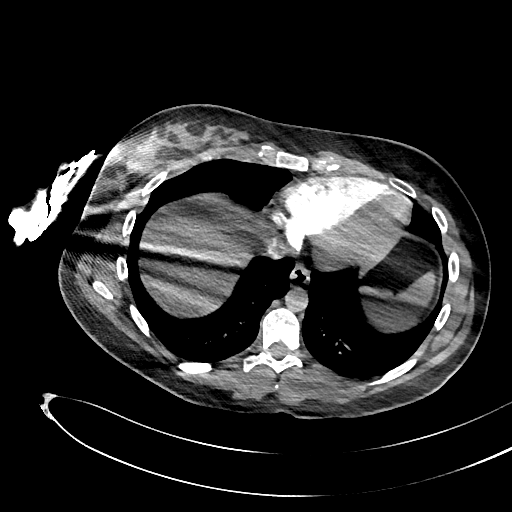
[im 85/339  lung]
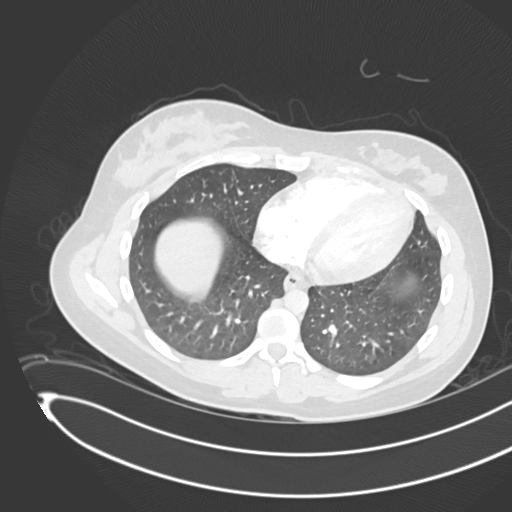
[im 102/339  mediastinal]
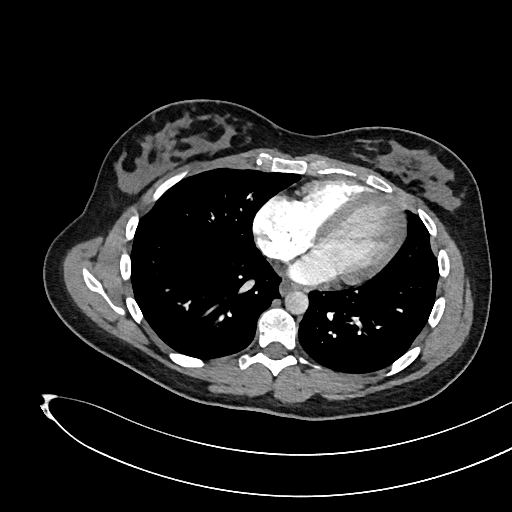
[im 119/339  lung]
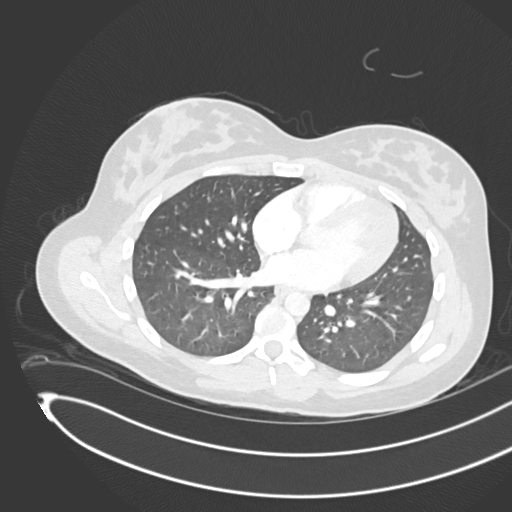
[im 136/339  mediastinal]
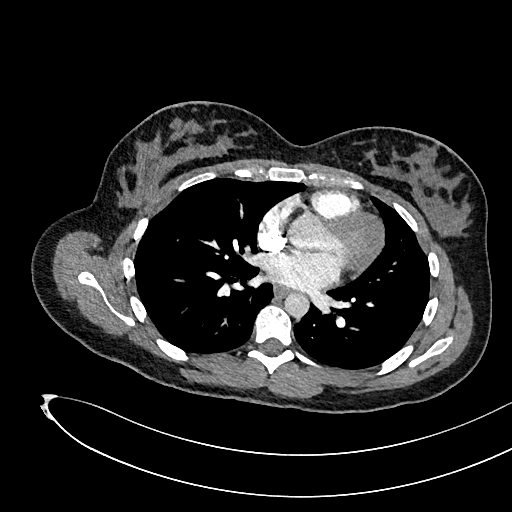
[im 153/339  lung]
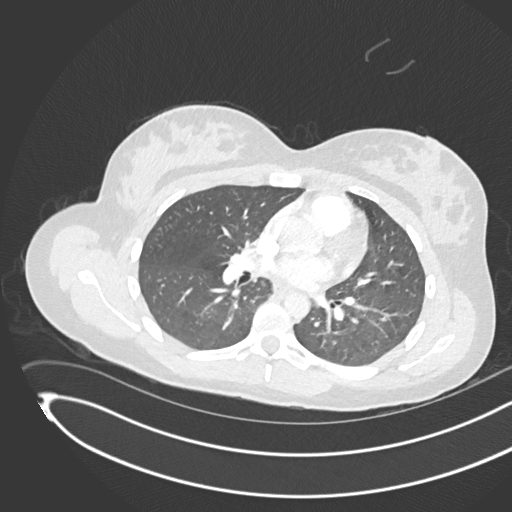
[im 186/339  mediastinal]
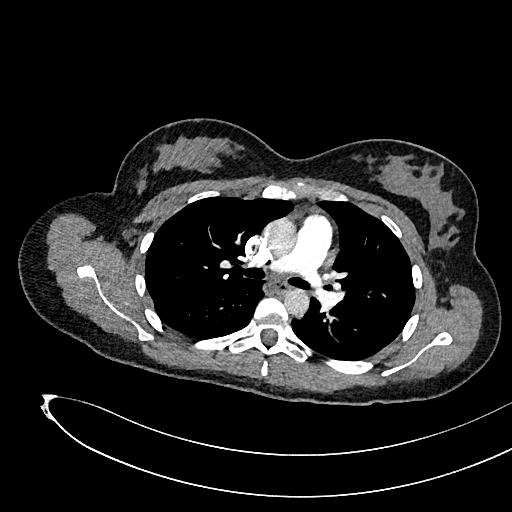
[im 203/339  lung]
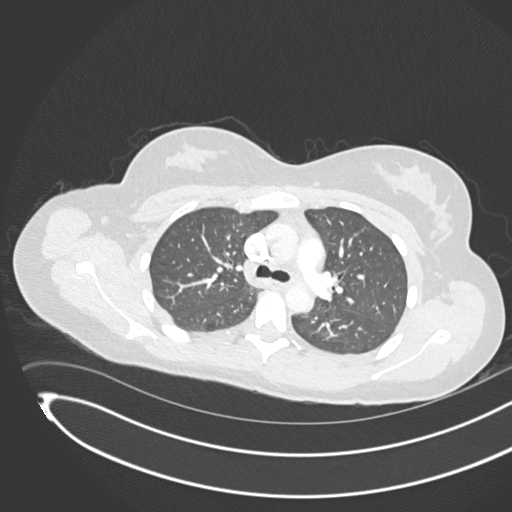
[im 220/339  mediastinal]
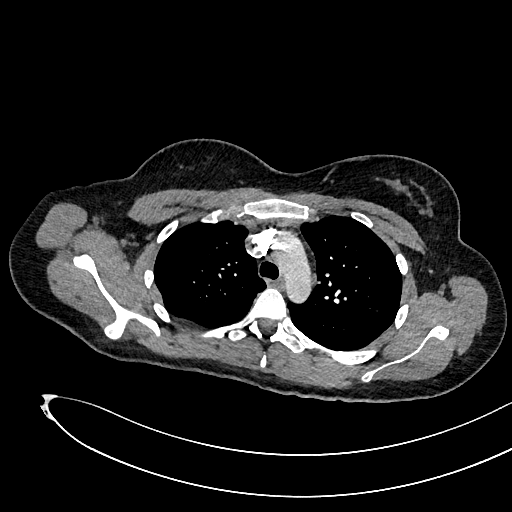
[im 237/339  lung]
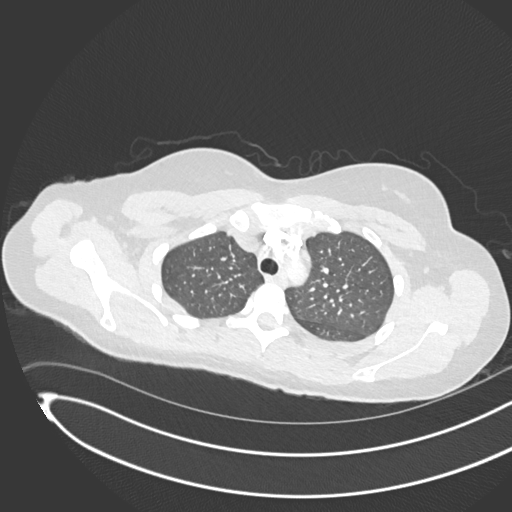
[im 254/339  mediastinal]
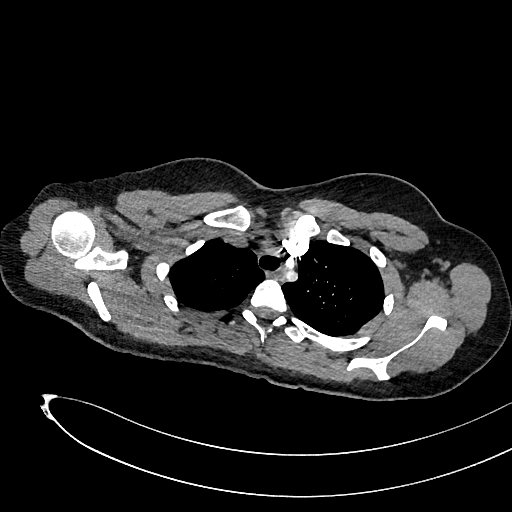
[im 271/339  lung]
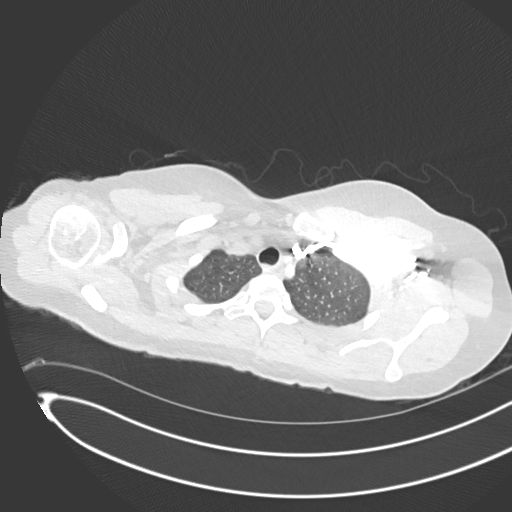
[im 288/339  mediastinal]
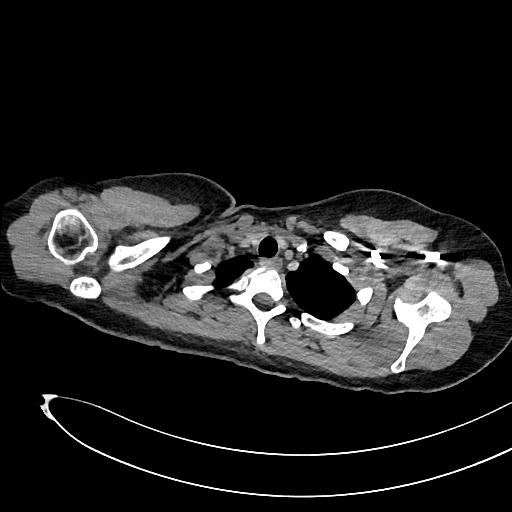
[im 305/339  lung]
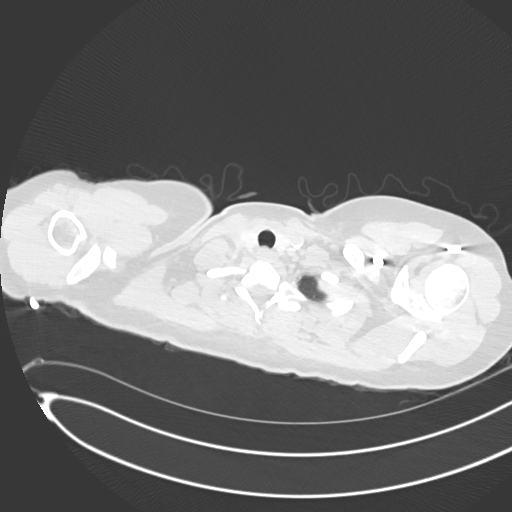
[im 322/339  mediastinal]
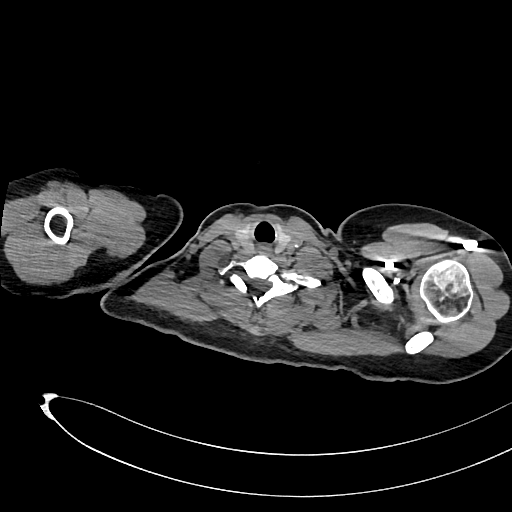

[Series 8: pe 2mm cor · coronal · 0.48mm/px · 1 of 151 slices shown]
[im 76/151  mediastinal]
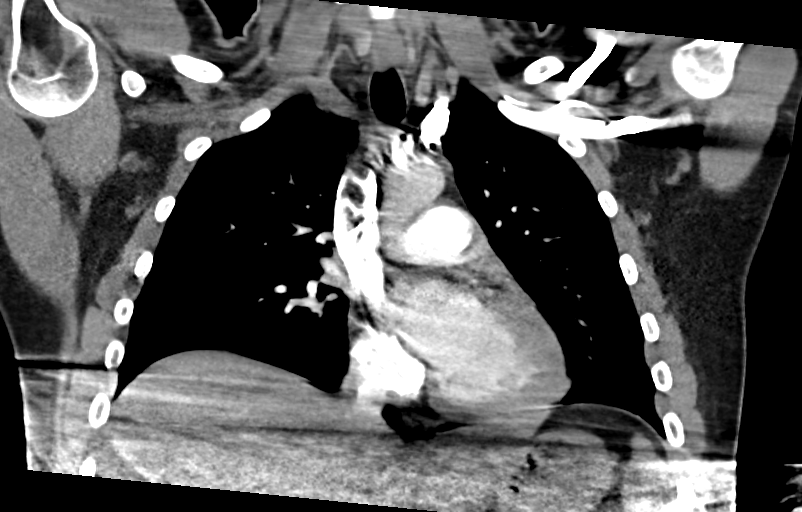

[19 of 36 positions shown; findings below may reference images not displayed]

FINDINGS: Cardiovascular: Satisfactory opacification of the pulmonary arteries
to the segmental level. No evidence of pulmonary embolism. Normal
heart size. No pericardial effusion.

Mediastinum/Nodes: No enlarged mediastinal, hilar, or axillary lymph
nodes. Thyroid gland, trachea, and esophagus demonstrate no
significant findings.

Lungs/Pleura: Lungs are clear. No pleural effusion or pneumothorax.

Upper Abdomen: No acute abnormality.

Musculoskeletal: No chest wall abnormality. No acute or significant
osseous findings.

Review of the MIP images confirms the above findings.
IMPRESSION: No definite evidence of pulmonary embolus. No definite abnormality
seen in the chest.
